# Patient Record
Sex: Male | Born: 1970
Health system: Southern US, Community
[De-identification: ages and names within clinical notes are randomized; demographics above are authoritative.]

## PROBLEM LIST (undated history)

## (undated) DIAGNOSIS — K219 Gastro-esophageal reflux disease without esophagitis: Secondary | ICD-10-CM

## (undated) DIAGNOSIS — E119 Type 2 diabetes mellitus without complications: Secondary | ICD-10-CM

## (undated) DIAGNOSIS — I10 Essential (primary) hypertension: Secondary | ICD-10-CM

## (undated) HISTORY — PX: EYE SURGERY: SHX253

## (undated) HISTORY — DX: Type 2 diabetes mellitus without complications: E11.9

## (undated) HISTORY — DX: Essential (primary) hypertension: I10

## (undated) HISTORY — DX: Gastro-esophageal reflux disease without esophagitis: K21.9

---

## 2002-06-02 ENCOUNTER — Emergency Department (HOSPITAL_COMMUNITY): Admission: EM | Admit: 2002-06-02 | Discharge: 2002-06-02 | Payer: Self-pay

## 2003-05-27 ENCOUNTER — Encounter: Admission: RE | Admit: 2003-05-27 | Discharge: 2003-08-25 | Payer: Self-pay | Admitting: Family Medicine

## 2008-03-12 ENCOUNTER — Emergency Department (HOSPITAL_COMMUNITY): Admission: EM | Admit: 2008-03-12 | Discharge: 2008-03-12 | Payer: Self-pay | Admitting: Emergency Medicine

## 2008-03-30 ENCOUNTER — Emergency Department (HOSPITAL_COMMUNITY): Admission: EM | Admit: 2008-03-30 | Discharge: 2008-03-30 | Payer: Self-pay | Admitting: Family Medicine

## 2008-05-05 ENCOUNTER — Emergency Department (HOSPITAL_COMMUNITY): Admission: EM | Admit: 2008-05-05 | Discharge: 2008-05-05 | Payer: Self-pay | Admitting: Family Medicine

## 2008-07-15 ENCOUNTER — Emergency Department (HOSPITAL_COMMUNITY): Admission: EM | Admit: 2008-07-15 | Discharge: 2008-07-15 | Payer: Self-pay | Admitting: Family Medicine

## 2008-09-22 ENCOUNTER — Ambulatory Visit: Payer: Self-pay | Admitting: Internal Medicine

## 2008-09-28 ENCOUNTER — Ambulatory Visit: Payer: Self-pay | Admitting: *Deleted

## 2008-10-07 ENCOUNTER — Ambulatory Visit: Payer: Self-pay | Admitting: Internal Medicine

## 2011-01-04 ENCOUNTER — Inpatient Hospital Stay (INDEPENDENT_AMBULATORY_CARE_PROVIDER_SITE_OTHER)
Admission: RE | Admit: 2011-01-04 | Discharge: 2011-01-04 | Disposition: A | Discharge status: Home / Self Care | Payer: Self-pay | Source: Ambulatory Visit | Attending: Family Medicine | Admitting: Family Medicine

## 2011-01-04 DIAGNOSIS — E119 Type 2 diabetes mellitus without complications: Secondary | ICD-10-CM

## 2011-01-04 LAB — POCT URINALYSIS DIP (DEVICE)
Bilirubin Urine: NEGATIVE
Glucose, UA: 500 mg/dL — AB
Hgb urine dipstick: NEGATIVE
Ketones, ur: NEGATIVE mg/dL
Specific Gravity, Urine: <=1.005 (ref 1.005–1.030)

## 2011-02-13 ENCOUNTER — Emergency Department (HOSPITAL_COMMUNITY)
Admission: EM | Admit: 2011-02-13 | Discharge: 2011-02-13 | Disposition: A | Discharge status: Home / Self Care | Payer: Self-pay | Source: Home / Self Care | Attending: Family Medicine | Admitting: Family Medicine

## 2011-02-13 ENCOUNTER — Encounter: Payer: Self-pay | Admitting: Emergency Medicine

## 2011-02-13 DIAGNOSIS — H0019 Chalazion unspecified eye, unspecified eyelid: Secondary | ICD-10-CM

## 2011-02-13 DIAGNOSIS — H109 Unspecified conjunctivitis: Secondary | ICD-10-CM

## 2011-02-13 DIAGNOSIS — H0011 Chalazion right upper eyelid: Secondary | ICD-10-CM

## 2011-02-13 MED ORDER — POLYMYXIN B-TRIMETHOPRIM 10000-0.1 UNIT/ML-% OP SOLN: 1.0000 [drp] | OPHTHALMIC | Status: AC

## 2011-02-13 MED ORDER — IBUPROFEN 800 MG PO TABS: 800.0000 mg | ORAL_TABLET | Freq: Three times a day (TID) | ORAL | Status: AC | PRN

## 2011-02-13 NOTE — ED Notes (Signed)
Pt here with sore throat and tenderness to bilat neck with swallowing and right eye drainage and crusty upon waking up that started x 1wk ago.denies dizziness but has some blurry vision.pt used tea bag and states it was effective.

## 2011-02-13 NOTE — ED Provider Notes (Signed)
History     CSN: [id number] Arrival date & time: 02/13/2011 10:06 AM   First MD Initiated Contact with Patient 02/13/11 1045      Chief Complaint  Patient presents with  . Eye Drainage  . Sore Throat    (Consider location/radiation/quality/duration/timing/severity/associated sxs/prior treatment) HPI Comments: Ediel presents for evaluation of a swelling on his RIGHT upper eyelid over the last week. He tried using a teabag with some relief. He denies any pain or visual symptoms. He also reports bilateral eye redness with itching and tearing. He denies any visual disturbance. He also reports sore throat without cough, no fever.   Patient is a 40 y.o. male presenting with pharyngitis and eye problem. The history is provided by the patient.  Sore Throat This is a new problem. The current episode started more than 2 days ago. The problem occurs constantly. The problem has not changed since onset.Pertinent negatives include no shortness of breath. The symptoms are aggravated by nothing. The symptoms are relieved by nothing.  Eye Problem  This is a new problem. The current episode started more than 2 days ago. The problem occurs constantly. The problem has been gradually improving. There is pain in the right eye. There was no injury mechanism. The patient is experiencing no pain. There is no history of trauma to the eye. There is no known exposure to pink eye. He does not wear contacts. Associated symptoms include eye redness and itching. Pertinent negatives include no blurred vision, no decreased vision, no discharge and no double vision.    Past Medical History  Diagnosis Date  . Diabetes mellitus     History reviewed. No pertinent past surgical history.  History reviewed. No pertinent family history.  History  Substance Use Topics  . Smoking status: Never Smoker   . Smokeless tobacco: Not on file  . Alcohol Use: No      Review of Systems  Constitutional: Negative.   HENT:  Positive for sore throat. Negative for congestion, rhinorrhea and trouble swallowing.   Eyes: Positive for redness and itching. Negative for blurred vision, double vision and discharge.  Respiratory: Negative for cough, shortness of breath and wheezing.   Cardiovascular: Negative.   Gastrointestinal: Negative.   Genitourinary: Negative.   Musculoskeletal: Negative.   Skin: Positive for itching.  Neurological: Negative.     Allergies  Review of patient's allergies indicates no known allergies.  Home Medications   Current Outpatient Rx  Name Route Sig Dispense Refill  . INSULIN GLARGINE 100 UNIT/ML [place] SOLN Subcutaneous Inject 50 Units into the skin at bedtime.      Marland Kitchen POLYMYXIN B-TRIMETHOPRIM 10000-0.1 UNIT/ML-% OP SOLN Both Eyes Place 1 drop into both eyes every 4 (four) hours. 10 mL 0    BP 126/86  Pulse 71  Temp(Src) 98.5 F (36.9 C) (Oral)  Resp 16  SpO2 100%  Physical Exam  Nursing note and vitals reviewed. Constitutional: He is oriented to person, place, and time. He appears well-developed and well-nourished.  HENT:  Head: Normocephalic and atraumatic.  Right Ear: Tympanic membrane normal.  Left Ear: Tympanic membrane normal.  Mouth/Throat: Uvula is midline, oropharynx is clear and moist and mucous membranes are normal.  Eyes: EOM are normal. Pupils are equal, round, and reactive to light. Right eye exhibits hordeolum. Right eye exhibits no discharge and no exudate. Left eye exhibits no discharge, no exudate and no hordeolum. Right conjunctiva is injected. Right conjunctiva has no hemorrhage. Left conjunctiva is injected. Left conjunctiva has no hemorrhage.  Neck: Normal range of motion.  Pulmonary/Chest: Effort normal and breath sounds normal.  Musculoskeletal: Normal range of motion.  Neurological: He is alert and oriented to person, place, and time.  Skin: Skin is warm and dry.  Psychiatric: His behavior is normal.    ED Course  Procedures (including critical care  time)  Labs Reviewed - No data to display No results found.   1. Chalazion of right upper eyelid   2. Conjunctivitis       MDM          Richardo Priest, MD 02/13/11 1159

## 2011-07-21 ENCOUNTER — Emergency Department (INDEPENDENT_AMBULATORY_CARE_PROVIDER_SITE_OTHER)
Admission: EM | Admit: 2011-07-21 | Discharge: 2011-07-21 | Disposition: A | Discharge status: Home / Self Care | Payer: Self-pay | Source: Home / Self Care | Attending: Emergency Medicine | Admitting: Emergency Medicine

## 2011-07-21 ENCOUNTER — Encounter (HOSPITAL_COMMUNITY): Payer: Self-pay | Admitting: *Deleted

## 2011-07-21 DIAGNOSIS — N342 Other urethritis: Secondary | ICD-10-CM

## 2011-07-21 LAB — POCT URINALYSIS DIP (DEVICE)
Hgb urine dipstick: NEGATIVE
Nitrite: NEGATIVE
Specific Gravity, Urine: 1.01 (ref 1.005–1.030)
Urobilinogen, UA: 0.2 mg/dL (ref 0.0–1.0)
pH: 5 (ref 5.0–8.0)

## 2011-07-21 MED ORDER — LIDOCAINE HCL (PF) 1 % IJ SOLN
INTRAMUSCULAR | Status: AC
  Filled 2011-07-21: qty 5

## 2011-07-21 MED ORDER — CEFTRIAXONE SODIUM 250 MG IJ SOLR
INTRAMUSCULAR | Status: AC
  Filled 2011-07-21: qty 250

## 2011-07-21 MED ORDER — AZITHROMYCIN 250 MG PO TABS
ORAL_TABLET | ORAL | Status: AC
  Filled 2011-07-21: qty 4

## 2011-07-21 MED ORDER — CEFTRIAXONE SODIUM 250 MG IJ SOLR
250.0000 mg | Freq: Once | INTRAMUSCULAR | Status: AC
  Administered 2011-07-21: 250 mg via INTRAMUSCULAR

## 2011-07-21 MED ORDER — AZITHROMYCIN 250 MG PO TABS
1000.0000 mg | ORAL_TABLET | Freq: Once | ORAL | Status: AC
  Administered 2011-07-21: 1000 mg via ORAL

## 2011-07-21 NOTE — ED Notes (Signed)
PT  REPORTS  NO  PAIN  EXCEPT  WHEN  HE  URINATES  AND  THEN  IT IS  ABOUT    4    WHICH  HE  DESCIBES  AS  A  BURNING  SENSATION

## 2011-07-21 NOTE — ED Notes (Signed)
pT  REPORTS   HE  WAS  STRUCK  IN  GENITAL  AREA   ABOUT      6  DAYS      AGO         HE    STATES HE  HAS  PAINFULL URINATION AS  WELL    AS     FLUID  COMING FROM [facility]   X  3  DAYS  -  HE  REPORTS  SHE   HAD  SOME  TESTICULER  SWELLING  EARLIER BUT THAT  IS  BETTER  AT THIS  TIME

## 2011-07-21 NOTE — Discharge Instructions (Signed)
Urethritis, Adult  Urethritis is an inflammation (soreness) of the urethra (the tube exiting from the bladder). It is often caused by germs that may be spread through sexual contact.  TREATMENT   Urethritis will usually respond to antibiotics. These are medications that kill germs. Take all the medicine given to you. You may feel better in a couple days, but TAKE ALL MEDICINE or the infection may not be completely cured and may become more difficult to treat. Response can generally be expected in 7 to 10 days. You may require additional treatment after more testing.  HOME CARE INSTRUCTIONS   Not have sex until the test results are known and treatment is completed.   Know that you may be asked to notify your sex partner when your final test results are back.   Finish all medications as prescribed.   Prevent sexually transmitted infections including AIDS. Practice safe sex. Use condoms.  SEEK MEDICAL CARE IF:    Your symptoms are not improved in 2 to 3 days.   Your symptoms are getting worse.   Your develop abdominal pain.   You develop joint pain.  SEEK IMMEDIATE MEDICAL CARE IF:    You have a fever.   You develop severe pain in the belly, back or side.   You develop repeated vomiting.  TEST RESULTS  Not all test results are available during your visit. If your test results are not back during the visit, make an appointment with your caregiver to find out the results. Do not assume everything is normal if you have not heard from your caregiver or the medical facility. It is important for you to follow-up on all of your test results.  Document Released: 08/16/2000 Document Revised: 02/09/2011 Document Reviewed: 03/08/2009  [employer] Patient Information 2012 [employer].

## 2011-07-21 NOTE — ED Provider Notes (Signed)
Chief Complaint  Patient presents with  . Dysuria    History of Present Illness:   The patient is a 41 year old male who was struck in the testicles a week ago with a soccer ball. This particular structure right testicle he had pain in her about a day but then went way after that. He was well up until 2 days ago when he developed slight dysuria, decrease in his stream, and a clear urethral discharge. He denies any swelling of the testicles or pain the testicles or lumps. He denies any lesions of the penis, fever, chills, adenopathy, skin rash, or joint pain.  Review of Systems:  Other than noted above, the patient denies any of the following symptoms: General:  No fevers, chills, sweats, aches, or fatigue. GI:  No abdominal pain, back pain, nausea, vomiting, diarrhea, or constipation. GU:  No dysuria, frequency, urgency, hematuria, urethral discharge, penile lesions, penile pain, testicular pain, swelling, or mass, inguinal lymphadenopathy or incontinence.  PMFSH:  Past medical history, family history, social history, meds, and allergies were reviewed.  Physical Exam:   Vital signs:  BP 114/65  Pulse 74  Temp(Src) 98.2 F (36.8 C) (Oral)  Resp 16  SpO2 98% Gen:  Alert, oriented, in no distress. Lungs:  Clear to auscultation, no wheezes, rales or rhonchi. Heart:  Regular rhythm, no gallop or murmer. Abdomen:  Flat and soft.  No tenderness to palpation, guarding, or rebound.  No hepato-splenomegaly or mass.  Bowel sounds were normally active.  No hernia. Genital exam:  Normal external genitalia. No lesions on the penis. No urethral discharge. The urethra appears normal. No testicular tenderness, swelling, or pain to palpation. No masses. Rectal exam:  Digital rectal exam reveals no masses, normal prostate, no tenderness, no nodules, and heme-negative stool. Back:  No CVA tenderness.  Skin:  Clear, warm and dry.  Labs:   Results for orders placed during the hospital encounter of 07/21/11    POCT URINALYSIS DIP (DEVICE)      Component Value Range   Glucose, UA 500 (*) NEGATIVE (mg/dL)   Bilirubin Urine NEGATIVE  NEGATIVE    Ketones, ur NEGATIVE  NEGATIVE (mg/dL)   Specific Gravity, Urine 1.010  1.005 - 1.030    Hgb urine dipstick NEGATIVE  NEGATIVE    pH 5.0  5.0 - 8.0    Protein, ur NEGATIVE  NEGATIVE (mg/dL)   Urobilinogen, UA 0.2  0.0 - 1.0 (mg/dL)   Nitrite NEGATIVE  NEGATIVE    Leukocytes, UA NEGATIVE  NEGATIVE   OCCULT BLOOD, POC DEVICE      Component Value Range   Fecal Occult Bld NEGATIVE      Course in Urgent Care Center:   He was given Rocephin 250 mg IM and azithromycin 1000 mg by mouth. GC and Chlamydia DNA probes were obtained.  Assessment: The encounter diagnosis was Urethritis.   Plan:   1.  The following meds were prescribed:   New Prescriptions   No medications on file   2.  The patient was instructed in symptomatic care and handouts were given. 3.  The patient was told to return if becoming worse in any way, if no better in 3 or 4 days, and given some red flag symptoms that would indicate earlier return.     Reuben Likes, MD 07/21/11 [phone]

## 2011-07-22 LAB — URINE CULTURE
Colony Count: NO GROWTH
Culture  Setup Time: 201305171642
Special Requests: NORMAL

## 2011-07-22 LAB — GC/CHLAMYDIA PROBE AMP, GENITAL: Chlamydia, DNA Probe: NEGATIVE

## 2011-11-24 ENCOUNTER — Encounter (HOSPITAL_COMMUNITY): Payer: Self-pay

## 2011-11-24 ENCOUNTER — Emergency Department (INDEPENDENT_AMBULATORY_CARE_PROVIDER_SITE_OTHER)
Admission: EM | Admit: 2011-11-24 | Discharge: 2011-11-24 | Disposition: A | Discharge status: Home / Self Care | Payer: Self-pay | Source: Home / Self Care

## 2011-11-24 DIAGNOSIS — E119 Type 2 diabetes mellitus without complications: Secondary | ICD-10-CM

## 2011-11-24 MED ORDER — GLUCOSE BLOOD VI STRP: ORAL_STRIP | Status: DC

## 2011-11-24 MED ORDER — INSULIN GLARGINE 100 UNIT/ML ~~LOC~~ SOLN: 50.0000 [IU] | Freq: Every day | SUBCUTANEOUS | Status: DC

## 2011-11-24 NOTE — ED Provider Notes (Signed)
History     CSN: [id number]  Arrival date & time 11/24/11  1453   None     Chief Complaint  Patient presents with  . Medication Refill    (Consider location/radiation/quality/duration/timing/severity/associated sxs/prior treatment) HPI Comments: This is a former health service patient and is a type II diabetic. He is here to request refills for his insulin solostar pen and testing strips. He denies any symptoms or problems and is currently looking for another physician to see him for his chronic illnesses.   Past Medical History  Diagnosis Date  . Diabetes mellitus     History reviewed. No pertinent past surgical history.  No family history on file.  History  Substance Use Topics  . Smoking status: Never Smoker   . Smokeless tobacco: Not on file  . Alcohol Use: No      Review of Systems  Constitutional: Negative.   Respiratory: Negative.   Cardiovascular: Negative.   Genitourinary: Negative.     Allergies  Review of patient's allergies indicates no known allergies.  Home Medications   Current Outpatient Rx  Name Route Sig Dispense Refill  . GLUCOSE BLOOD VI STRP Other 1 each by Other route as needed. Use as instructed    . INSULIN GLARGINE 100 UNIT/ML [place] SOLN Subcutaneous Inject 50 Units into the skin at bedtime.      Marland Kitchen GLUCOSE BLOOD VI STRP  Check your sugar in the morning before you eat breakfast, and one hour after a meal. 100 each 12  . INSULIN GLARGINE 100 UNIT/ML [place] SOLN Subcutaneous Inject 50 Units into the skin at bedtime. 10 mL 0    BP 138/93  Pulse 72  Temp 98.5 F (36.9 C) (Oral)  Resp 18  SpO2 98%  Physical Exam  Constitutional: He is oriented to person, place, and time. He appears well-developed and well-nourished. No distress.  Neck: Normal range of motion. Neck supple.  Pulmonary/Chest: Effort normal and breath sounds normal. No respiratory distress.  Abdominal: Soft. There is no tenderness.  Musculoskeletal: Normal range of motion.  He exhibits no edema and no tenderness.  Lymphadenopathy:    He has no cervical adenopathy.  Neurological: He is alert and oriented to person, place, and time. No cranial nerve deficit.  Skin: Skin is warm and dry.    ED Course  Procedures (including critical care time)  Labs Reviewed - No data to display No results found.   1. T2DM (type 2 diabetes mellitus)       MDM  Refill Lantus Solostar 50 units q hs Ultra ONe Strips for testing.        Hayden Rasmussen, NP 11/24/11 1644

## 2011-11-24 NOTE — ED Provider Notes (Signed)
Medical screening examination/treatment/procedure(s) were performed by non-physician practitioner and as supervising physician I was immediately available for consultation/collaboration.  Raynald Blend, MD 11/24/11 [phone]

## 2011-11-24 NOTE — ED Notes (Signed)
Patient needs to have Solostar pens refilled and test strips, he test 3x daily

## 2012-05-09 ENCOUNTER — Encounter (HOSPITAL_COMMUNITY): Payer: Self-pay | Admitting: Emergency Medicine

## 2012-05-09 ENCOUNTER — Emergency Department (HOSPITAL_COMMUNITY)
Admission: EM | Admit: 2012-05-09 | Discharge: 2012-05-09 | Disposition: A | Discharge status: Home / Self Care | Payer: Self-pay | Source: Home / Self Care

## 2012-05-09 MED ORDER — INSULIN GLARGINE 100 UNIT/ML ~~LOC~~ SOLN: 50.0000 [IU] | Freq: Every day | SUBCUTANEOUS | Status: DC

## 2012-05-09 MED ORDER — GLUCOSE BLOOD VI STRP: ORAL_STRIP | Status: DC

## 2012-05-09 NOTE — ED Provider Notes (Signed)
Patient Demographics  Jake Miller, is a 42 y.o. male  ZOX:[id number]  WJX:[id number]  DOB - 12-24-1970  Chief Complaint  Patient presents with  . Follow-up  . Medication Refill        Subjective:   [place]ane [place] today has, No headache, No chest pain, No abdominal pain - No Nausea, No new weakness tingling or numbness, No Cough - SOB.    Objective:    Filed Vitals:   05/09/12 1717  BP: 104/74  Pulse: 81  Temp: 97.9 F (36.6 C)  TempSrc: Oral  Resp: 18  SpO2: 100%     Exam  Awake Alert, Oriented X 3, No new F.N deficits, Normal affect [place].AT,PERRAL Supple Neck,No JVD, No cervical lymphadenopathy appriciated.  Symmetrical Chest wall movement, Good air movement bilaterally, CTAB RRR,No Gallops,Rubs or new Murmurs, No Parasternal Heave +ve B.Sounds, Abd Soft, Non tender, No organomegaly appriciated, No rebound - guarding or rigidity. No Cyanosis, Clubbing or edema, No new Rash or bruise       Data Review   CBC No results found for this basename: WBC, HGB, HCT, PLT, MCV, MCH, MCHC, RDW, NEUTRABS, LYMPHSABS, MONOABS, EOSABS, BASOSABS, BANDABS, BANDSABD,  in the last 168 hours  Chemistries   No results found for this basename: NA, K, CL, CO2, GLUCOSE, BUN, CREATININE, GFRCGP, CALCIUM, MG, AST, ALT, ALKPHOS, BILITOT,  in the last 168 hours ------------------------------------------------------------------------------------------------------------------ No results found for this basename: HGBA1C,  in the last 72 hours ------------------------------------------------------------------------------------------------------------------ No results found for this basename: CHOL, HDL, LDLCALC, TRIG, CHOLHDL, LDLDIRECT,  in the last 72 hours ------------------------------------------------------------------------------------------------------------------ No results found for this basename: TSH, T4TOTAL, FREET3, T3FREE, THYROIDAB,  in the last 72  hours ------------------------------------------------------------------------------------------------------------------ No results found for this basename: VITAMINB12, FOLATE, FERRITIN, TIBC, IRON, RETICCTPCT,  in the last 72 hours  Coagulation profile  No results found for this basename: INR, PROTIME,  in the last 168 hours     Prior to Admission medications   Medication Sig Start Date End Date Taking? Authorizing Provider  glucose blood test strip Check your sugar in the morning before you eat breakfast, and one hour after a meal. 05/09/12   Leroy Sea, MD  insulin glargine (LANTUS) 100 UNIT/ML injection Inject 50 Units into the skin at bedtime. 05/09/12   Leroy Sea, MD     Assessment & Plan   DM-2 - on lantus only for yrs, checks CBG once only in am, counseled on QAC-HS accuchecks, and to come back in a week with log book, will check A1c today. Lantus and testing supplies given.    Follow-up Information   Follow up with primary care provider. Schedule an appointment as soon as possible for a visit in 1 week.       Leroy Sea M.D on 05/09/2012 at 5:23 PM  Leroy Sea, MD 05/09/12 [phone]

## 2012-05-09 NOTE — ED Notes (Signed)
Pt is here for a f/u and needing refill on his meds Denies any medical problems today  He is alert and oriented w/no signs of acute distress.

## 2012-05-20 ENCOUNTER — Encounter (HOSPITAL_COMMUNITY): Payer: Self-pay | Admitting: *Deleted

## 2012-05-20 ENCOUNTER — Emergency Department (HOSPITAL_COMMUNITY)
Admission: EM | Admit: 2012-05-20 | Discharge: 2012-05-20 | Disposition: A | Discharge status: Home / Self Care | Payer: No Typology Code available for payment source | Source: Home / Self Care

## 2012-05-20 NOTE — ED Provider Notes (Signed)
History     CSN: [id number]  Arrival date & time 05/20/12  1303   None     Chief Complaint  Patient presents with  . Follow-up    (Consider location/radiation/quality/duration/timing/severity/associated sxs/prior treatment) HPI  Pt is 42 yo male who presents for follow up on diabetes and would like to have prescription for insulin pen. Patient reports being regular state of health, no chest pain or shortness of breath, no specific abdominal or urinary concerns, no recent sicknesses or hospitalizations. Patient reports checking sugar levels regularly and the numbers usually stay between 100  -  150.   Past Medical History  Diagnosis Date  . Diabetes mellitus     History reviewed. No pertinent past surgical history.  No family medical history per pt  History  Substance Use Topics  . Smoking status: Never Smoker   . Smokeless tobacco: Not on file  . Alcohol Use: No    Review of Systems  Constitutional: Negative for fever, chills, diaphoresis, activity change, appetite change and fatigue.  HENT: Negative for ear pain, nosebleeds, congestion, facial swelling, rhinorrhea, neck pain, neck stiffness and ear discharge.   Eyes: Negative for pain, discharge, redness, itching and visual disturbance.  Respiratory: Negative for cough, choking, chest tightness, shortness of breath, wheezing and stridor.   Cardiovascular: Negative for chest pain, palpitations and leg swelling.  Gastrointestinal: Negative for abdominal distention.  Genitourinary: Negative for dysuria, urgency, frequency, hematuria, flank pain, decreased urine volume, difficulty urinating and dyspareunia.  Musculoskeletal: Negative for back pain, joint swelling, arthralgias and gait problem.  Neurological: Negative for dizziness, tremors, seizures, syncope, facial asymmetry, speech difficulty, weakness, light-headedness, numbness and headaches.  Hematological: Negative for adenopathy. Does not bruise/bleed easily.   Psychiatric/Behavioral: Negative for hallucinations, behavioral problems, confusion, dysphoric mood, decreased concentration and agitation.    Allergies  Review of patient's allergies indicates no known allergies.  Home Medications   Current Outpatient Rx  Name  Route  Sig  Dispense  Refill  . glucose blood test strip      Check your sugar in the morning before you eat breakfast, and one hour after a meal.   100 each   12   . insulin glargine (LANTUS) 100 UNIT/ML injection   Subcutaneous   Inject 50 Units into the skin at bedtime.   10 mL   2     BP 130/88  Pulse 80  Temp(Src) 98.4 F (36.9 C) (Oral)  Resp 16  SpO2 98%  Physical Exam  Constitutional: Appears well-developed and well-nourished. No distress.  HENT: Normocephalic. External right and left ear normal. Oropharynx is clear and moist.  Eyes: Conjunctivae and EOM are normal. PERRLA, no scleral icterus.  Neck: Normal ROM. Neck supple. No JVD. No tracheal deviation. No thyromegaly.  CVS: RRR, S1/S2 +, no murmurs, no gallops, no carotid bruit.  Pulmonary: Effort and breath sounds normal, no stridor, rhonchi, wheezes, rales.  Abdominal: Soft. BS +,  no distension, tenderness, rebound or guarding.  Musculoskeletal: Normal range of motion. No edema and no tenderness.  Lymphadenopathy: No lymphadenopathy noted, cervical, inguinal. Neuro: Alert. Normal reflexes, muscle tone coordination. No cranial nerve deficit. Skin: Skin is warm and dry. No rash noted. Not diaphoretic. No erythema. No pallor.  Psychiatric: Normal mood and affect. Behavior, judgment, thought content normal.    ED Course  Procedures (including critical care time)  Labs Reviewed - No data to display No results found.   1. Diabetes   - patient has brought logbook as recommended by  physician, numbers range from 100-169 - I will provide prescription for insulin Lantus pen, called he'll department pharmacy at [phone] - Patient advised to come  back in 3 months to have A1c checked - We have discussed continuing to monitor sugar levels as patient is already doing, he was complemented on his hard work    MDM  Diabetes         Dorothea Ogle, MD 05/20/12 1346

## 2012-05-20 NOTE — ED Notes (Signed)
Patient states he is here for follow up for labs drawn last week. States Rx for insulin was written wrong; pharmacy told him that they could not get in touch with the doctor to correct problem.

## 2012-11-18 ENCOUNTER — Encounter (HOSPITAL_COMMUNITY): Payer: Self-pay | Admitting: Emergency Medicine

## 2012-11-18 ENCOUNTER — Emergency Department (HOSPITAL_COMMUNITY)
Admission: EM | Admit: 2012-11-18 | Discharge: 2012-11-18 | Disposition: A | Discharge status: Home / Self Care | Payer: No Typology Code available for payment source | Source: Home / Self Care | Attending: Family Medicine | Admitting: Family Medicine

## 2012-11-18 DIAGNOSIS — E119 Type 2 diabetes mellitus without complications: Secondary | ICD-10-CM

## 2012-11-18 MED ORDER — OMEPRAZOLE 20 MG PO CPDR: 20.0000 mg | DELAYED_RELEASE_CAPSULE | Freq: Every day | ORAL | Status: DC

## 2012-11-18 MED ORDER — INSULIN GLARGINE 100 UNIT/ML ~~LOC~~ SOLN: 50.0000 [IU] | Freq: Every day | SUBCUTANEOUS | Status: DC

## 2012-11-18 NOTE — ED Provider Notes (Signed)
Jake Miller is a 42 y.o. male who presents to Urgent Care today for Lantus refill. Patient is doing well with no symptoms and diabetes. He ran out yesterday evening. [facility] He denies any polyuria or hypoglycemic symptoms.   He also notes of the past month or so intermittent abdominal pain. This does to occur after eating. It feels like previous episodes of heartburn. He's taking some over-the-counter acid blocking medications which have helped. He would like a prescription for this or possible. He denies any nausea vomiting or diarrhea. He denies any blood in the stool. He feels well otherwise   Past Medical History  Diagnosis Date  . Diabetes mellitus    History  Substance Use Topics  . Smoking status: Never Smoker   . Smokeless tobacco: Not on file  . Alcohol Use: No   ROS as above: No fevers or chills or significant weight loss or weight Medications reviewed. No current facility-administered medications for this encounter.   Current Outpatient Prescriptions  Medication Sig Dispense Refill  . glucose blood test strip Check your sugar in the morning before you eat breakfast, and one hour after a meal.  100 each  12  . insulin glargine (LANTUS) 100 UNIT/ML injection Inject 0.5 mLs (50 Units total) into the skin at bedtime.  10 mL  2  . omeprazole (PRILOSEC) 20 MG capsule Take 1 capsule (20 mg total) by mouth daily.  30 capsule  2    Exam:  BP 115/82  Pulse 74  Temp(Src) 97.8 F (36.6 C) (Oral)  Resp 16  SpO2 98% Gen: Well NAD HEENT: EOMI,  MMM Lungs: CTABL Nl WOB Heart: RRR no MRG Abd: NABS, NT, ND Exts: Non edematous BL  LE, warm and well perfused.   No results found for this or any previous visit (from the past 24 hour(s)). No results found.  Assessment and Plan: 42 y.o. male with  1) diabetes: Refill Lantus. Followup with primary care provider for diabetes.  2) abdominal pain: Likely reflux related.  We'll use omeprazole and follow up with primary care provider. Discussed warning signs or symptoms. Please see discharge instructions. Patient expresses understanding.      Rodolph Bong, MD 11/18/12 [phone]

## 2012-11-18 NOTE — ED Notes (Signed)
Pt here for medication refill. [place] 50 units. States "ran out last night" Voices no concerns at this time.

## 2013-01-06 ENCOUNTER — Encounter: Payer: Self-pay | Admitting: Internal Medicine

## 2013-01-06 ENCOUNTER — Telehealth: Payer: Self-pay

## 2013-01-06 ENCOUNTER — Ambulatory Visit: Payer: No Typology Code available for payment source | Attending: Internal Medicine | Admitting: Internal Medicine

## 2013-01-06 VITALS — BP 130/90 | HR 83 | Temp 98.9°F | Resp 16 | Ht 68.0 in | Wt 187.0 lb

## 2013-01-06 DIAGNOSIS — Z794 Long term (current) use of insulin: Secondary | ICD-10-CM

## 2013-01-06 DIAGNOSIS — E119 Type 2 diabetes mellitus without complications: Secondary | ICD-10-CM | POA: Insufficient documentation

## 2013-01-06 DIAGNOSIS — K219 Gastro-esophageal reflux disease without esophagitis: Secondary | ICD-10-CM | POA: Insufficient documentation

## 2013-01-06 LAB — CBC WITH DIFFERENTIAL/PLATELET
Basophils Relative: 0 % (ref 0–1)
Eosinophils Absolute: 0.2 10*3/uL (ref 0.0–0.7)
HCT: 44.8 % (ref 39.0–52.0)
Hemoglobin: 16 g/dL (ref 13.0–17.0)
Lymphs Abs: 2.4 10*3/uL (ref 0.7–4.0)
MCH: 29.3 pg (ref 26.0–34.0)
MCHC: 35.7 g/dL (ref 30.0–36.0)
Monocytes Absolute: 0.8 10*3/uL (ref 0.1–1.0)
Monocytes Relative: 9 % (ref 3–12)
Neutro Abs: 5.6 10*3/uL (ref 1.7–7.7)
RBC: 5.46 MIL/uL (ref 4.22–5.81)

## 2013-01-06 LAB — CMP AND LIVER
Albumin: 4.4 g/dL (ref 3.5–5.2)
Alkaline Phosphatase: 86 U/L (ref 39–117)
BUN: 18 mg/dL (ref 6–23)
Bilirubin, Direct: 0.1 mg/dL (ref 0.0–0.3)
CO2: 28 mEq/L (ref 19–32)
Glucose, Bld: 332 mg/dL — ABNORMAL HIGH (ref 70–99)
Indirect Bilirubin: 0.6 mg/dL (ref 0.0–0.9)
Potassium: 4.6 mEq/L (ref 3.5–5.3)
Total Protein: 7.2 g/dL (ref 6.0–8.3)

## 2013-01-06 LAB — LIPID PANEL
Cholesterol: 188 mg/dL (ref 0–200)
Total CHOL/HDL Ratio: 5.2 Ratio
VLDL: 42 mg/dL — ABNORMAL HIGH (ref 0–40)

## 2013-01-06 LAB — HEMOGLOBIN A1C
Hgb A1c MFr Bld: 10.7 % — ABNORMAL HIGH (ref <5.7)
Mean Plasma Glucose: 260 mg/dL — ABNORMAL HIGH (ref <117)

## 2013-01-06 MED ORDER — OMEPRAZOLE 20 MG PO CPDR: 20.0000 mg | DELAYED_RELEASE_CAPSULE | Freq: Every day | ORAL | Status: DC

## 2013-01-06 MED ORDER — INSULIN GLARGINE 100 UNIT/ML ~~LOC~~ SOLN: 50.0000 [IU] | Freq: Every day | SUBCUTANEOUS | Status: DC

## 2013-01-06 NOTE — Progress Notes (Signed)
Patient ID: Jake Miller, male   DOB: [birth year], 42 y.o.   MRN: [id number] Patient Demographics  Jake Miller, is a 42 y.o. male  BJY:[id number]  YQM:[id number]  DOB - [birth year]  CC:  Chief Complaint  Patient presents with  . Establish Care       HPI: Jake Miller is a 42 y.o. male here today to establish medical care. Patient is known to have diabetes for the past several years on insulin Lantus. Patient claims the blood sugar has been within control, home blood glucose range between 85 in the morning and 180 during the day before dinner. Patient has no complaint related to diabetes complications. He claims to be doing well, exercise regularly, does not drink alcohol, he does not smoke cigarette. However recently patient has distance some epigastric pain especially when hungry, pain is relieved with eating, as well as Prilosec. No change in bowel habit, no vomiting. Patient has his eyes checked about 2 years ago by ophthalmologist, normal Patient has No headache, No chest pain, - No Nausea, No new weakness tingling or numbness, No Cough - SOB.  No Known Allergies Past Medical History  Diagnosis Date  . Diabetes mellitus    Current Outpatient Prescriptions on File Prior to Visit  Medication Sig Dispense Refill  . glucose blood test strip Check your sugar in the morning before you eat breakfast, and one hour after a meal.  100 each  12   No current facility-administered medications on file prior to visit.   History reviewed. No pertinent family history. History   Social History  . Marital Status: Married    Spouse Name: N/A    Number of Children: N/A  . Years of Education: N/A   Occupational History  . Not on file.   Social History Main Topics  . Smoking status: Never Smoker   . Smokeless tobacco: Not on file  . Alcohol Use: No  . Drug Use: No  . Sexual Activity: Yes   Other Topics Concern  . Not on file   Social History Narrative  . No narrative on file     Review of Systems: Constitutional: Negative for fever, chills, diaphoresis, activity change, appetite change and fatigue. HENT: Negative for ear pain, nosebleeds, congestion, facial swelling, rhinorrhea, neck pain, neck stiffness and ear discharge.  Eyes: Negative for pain, discharge, redness, itching and visual disturbance. Respiratory: Negative for cough, choking, chest tightness, shortness of breath, wheezing and stridor.  Cardiovascular: Negative for chest pain, palpitations and leg swelling. Gastrointestinal: Negative for abdominal distention. Genitourinary: Negative for dysuria, urgency, frequency, hematuria, flank pain, decreased urine volume, difficulty urinating and dyspareunia.  Musculoskeletal: Negative for back pain, joint swelling, arthralgia and gait problem. Neurological: Negative for dizziness, tremors, seizures, syncope, facial asymmetry, speech difficulty, weakness, light-headedness, numbness and headaches.  Hematological: Negative for adenopathy. Does not bruise/bleed easily. Psychiatric/Behavioral: Negative for hallucinations, behavioral problems, confusion, dysphoric mood, decreased concentration and agitation.    Objective:   Filed Vitals:   01/06/13 0922  BP: 130/90  Pulse: 83  Temp: 98.9 F (37.2 C)  Resp: 16    Physical Exam: Constitutional: Patient appears well-developed and well-nourished. No distress. HENT: Normocephalic, atraumatic, External right and left ear normal. Oropharynx is clear and moist.  Eyes: Conjunctivae and EOM are normal. PERRLA, no scleral icterus. Neck: Normal ROM. Neck supple. No JVD. No tracheal deviation. No thyromegaly. CVS: RRR, S1/S2 +, no murmurs, no gallops, no carotid bruit.  Pulmonary: Effort and breath sounds normal, no stridor, rhonchi, wheezes, rales.  Abdominal: Soft. BS +, no distension, tenderness, rebound or guarding.  Musculoskeletal: Normal range of motion. No edema and no tenderness.  Lymphadenopathy: No  lymphadenopathy noted, cervical, inguinal or axillary Neuro: Alert. Normal reflexes, muscle tone coordination. No cranial nerve deficit. Skin: Skin is warm and dry. No rash noted. Not diaphoretic. No erythema. No pallor. Psychiatric: Normal mood and affect. Behavior, judgment, thought content normal.  No results found for this basename: WBC, HGB, HCT, MCV, PLT   No results found for this basename: CREATININE, BUN, NA, K, CL, CO2    Lab Results  Component Value Date   HGBA1C 11.6* 05/09/2012   Lipid Panel  No results found for this basename: chol, trig, hdl, cholhdl, vldl, ldlcalc       Assessment and plan:   Patient Active Problem List   Diagnosis Date Noted  . Diabetes mellitus type 2, insulin dependent 01/06/2013  . GERD (gastroesophageal reflux disease) 01/06/2013    Plan: Refill insulin Lantus 50 units each bedtime Omeprazole 20 mg capsule by mouth daily Patient has been extensively counseled about nutrition and exercise Patient is to maintain a blood sugar log and bring to clinic to visit  Labs: Comprehensive metabolic panel CBC differential Lipid panel Urine microalbumin Microalbumin creatinine ratio Hemoglobin A1c      Follow up in 2 months or when necessary   The patient was given clear instructions to go to ER or return to medical center if symptoms don't improve, worsen or new problems develop. The patient verbalized understanding. The patient was told to call to get lab results if they haven't heard anything in the next week.     Jeanann Lewandowsky, MD, MHA, [facility] [place], [place] [phone]   01/06/2013, 9:53 AM

## 2013-01-06 NOTE — Progress Notes (Signed)
Pt is here to establish care. Pt is here for a regular check up requesting a physical. Pt needs a refill for his medications. Pt reports that for 4 months he is having stomach aches when he gets hungry.

## 2013-01-06 NOTE — Patient Instructions (Signed)
Diet for Gastroesophageal Reflux Disease, Adult Reflux (acid reflux) is when acid from your stomach flows up into the esophagus. When acid comes in contact with the esophagus, the acid causes irritation and soreness (inflammation) in the esophagus. When reflux happens often or so severely that it causes damage to the esophagus, it is called gastroesophageal reflux disease (GERD). Nutrition therapy can help ease the discomfort of GERD. FOODS OR DRINKS TO AVOID OR LIMIT  Smoking or chewing tobacco. Nicotine is one of the most potent stimulants to acid production in the gastrointestinal tract.  Caffeinated and decaffeinated coffee and black tea.  Regular or low-calorie carbonated beverages or energy drinks (caffeine-free carbonated beverages are allowed).   Strong spices, such as black pepper, white pepper, red pepper, cayenne, curry powder, and chili powder.  Peppermint or spearmint.  Chocolate.  High-fat foods, including meats and fried foods. Extra added fats including oils, butter, salad dressings, and nuts. Limit these to less than 8 tsp per day.  Fruits and vegetables if they are not tolerated, such as citrus fruits or tomatoes.  Alcohol.  Any food that seems to aggravate your condition. If you have questions regarding your diet, call your caregiver or a registered dietitian. OTHER THINGS THAT MAY HELP GERD INCLUDE:   Eating your meals slowly, in a relaxed setting.  Eating 5 to 6 small meals per day instead of 3 large meals.  Eliminating food for a period of time if it causes distress.  Not lying down until 3 hours after eating a meal.  Keeping the head of your bed raised 6 to 9 inches (15 to 23 cm) by using a foam wedge or blocks under the legs of the bed. Lying flat may make symptoms worse.  Being physically active. Weight loss may be helpful in reducing reflux in overweight or obese adults.  Wear loose fitting clothing EXAMPLE MEAL PLAN This meal plan is approximately  2,000 calories based on https://www.bernard.org/ meal planning guidelines. Breakfast   cup cooked oatmeal.  1 cup strawberries.  1 cup low-fat milk.  1 oz almonds. Snack  1 cup cucumber slices.  6 oz yogurt (made from low-fat or fat-free milk). Lunch  2 slice whole-wheat bread.  2 oz sliced [place].  2 tsp mayonnaise.  1 cup blueberries.  1 cup snap peas. Snack  6 whole-wheat crackers.  1 oz string cheese. Dinner   cup brown rice.  1 cup mixed veggies.  1 tsp olive oil.  3 oz grilled fish. Document Released: 02/20/2005 Document Revised: 05/15/2011 Document Reviewed: 01/06/2011 [employer] [facility] Patient Information 2014 [place], [place]. Diabetes and Exercise Regular exercise is important and can help:   Control blood glucose (sugar).  Decrease blood pressure.    Control blood lipids (cholesterol, triglycerides).  Improve overall health. BENEFITS FROM EXERCISE  Improved fitness.  Improved flexibility.  Improved endurance.  Increased bone density.  Weight control.  Increased muscle strength.  Decreased body fat.  Improvement of the body's use of insulin, a hormone.  Increased insulin sensitivity.  Reduction of insulin needs.  Reduced stress and tension.  Helps you feel better. People with diabetes who add exercise to their lifestyle gain additional benefits, including:  Weight loss.  Reduced appetite.  Improvement of the body's use of blood glucose.  Decreased risk factors for heart disease:  Lowering of cholesterol and triglycerides.  Raising the level of good cholesterol (high-density lipoproteins, HDL).  Lowering blood sugar.  Decreased blood pressure. TYPE 1 DIABETES AND EXERCISE  Exercise will usually lower your blood  glucose.  If blood glucose is greater than 240 mg/dl, check urine ketones. If ketones are present, do not exercise.  Location of the insulin injection sites may need to be adjusted with exercise. Avoid injecting  insulin into areas of the body that will be exercised. For example, avoid injecting insulin into:  The arms when playing tennis.  The legs when jogging. For more information, discuss this with your caregiver.  Keep a record of:  Food intake.  Type and amount of exercise.  Expected peak times of insulin action.  Blood glucose levels. Do this before, during, and after exercise. Review your records with your caregiver. This will help you to develop guidelines for adjusting food intake and insulin amounts.  TYPE 2 DIABETES AND EXERCISE  Regular physical activity can help control blood glucose.  Exercise is important because it may:  Increase the body's sensitivity to insulin.  Improve blood glucose control.  Exercise reduces the risk of heart disease. It decreases serum cholesterol and triglycerides. It also lowers blood pressure.  Those who take insulin or oral hypoglycemic agents should watch for signs of hypoglycemia. These signs include dizziness, shaking, sweating, chills, and confusion.  Body water is lost during exercise. It must be replaced. This will help to avoid loss of body fluids (dehydration) or heat stroke. Be sure to talk to your caregiver before starting an exercise program to make sure it is safe for you. Remember, any activity is better than none.  Document Released: 05/13/2003 Document Revised: 05/15/2011 Document Reviewed: 08/27/2008 [facility] Patient Information 2014 [place], [place].

## 2013-01-06 NOTE — Telephone Encounter (Signed)
Dawn called patient  Was given script for vials when he has been Using pens-ok to dispense pens

## 2013-01-07 LAB — MICROALBUMIN / CREATININE URINE RATIO: Microalb, Ur: 0.5 mg/dL (ref 0.00–1.89)

## 2013-01-08 ENCOUNTER — Other Ambulatory Visit: Payer: Self-pay

## 2013-01-08 MED ORDER — ESOMEPRAZOLE MAGNESIUM 40 MG PO CPDR: 40.0000 mg | DELAYED_RELEASE_CAPSULE | Freq: Every day | ORAL | Status: DC

## 2013-03-10 ENCOUNTER — Ambulatory Visit: Payer: Managed Care, Other (non HMO) | Attending: Internal Medicine | Admitting: Internal Medicine

## 2013-03-10 VITALS — BP 116/80 | HR 81 | Temp 98.7°F | Resp 14 | Ht 68.0 in | Wt 188.2 lb

## 2013-03-10 DIAGNOSIS — Z794 Long term (current) use of insulin: Secondary | ICD-10-CM

## 2013-03-10 DIAGNOSIS — E1310 Other specified diabetes mellitus with ketoacidosis without coma: Secondary | ICD-10-CM

## 2013-03-10 DIAGNOSIS — E1110 Type 2 diabetes mellitus with ketoacidosis without coma: Secondary | ICD-10-CM

## 2013-03-10 DIAGNOSIS — E119 Type 2 diabetes mellitus without complications: Secondary | ICD-10-CM | POA: Insufficient documentation

## 2013-03-10 DIAGNOSIS — K219 Gastro-esophageal reflux disease without esophagitis: Secondary | ICD-10-CM

## 2013-03-10 MED ORDER — INSULIN GLARGINE 100 UNIT/ML ~~LOC~~ SOLN: 50.0000 [IU] | Freq: Every day | SUBCUTANEOUS | Status: DC

## 2013-03-10 NOTE — Addendum Note (Signed)
Addended by: Susie CassetteABROL MD, Germain OsgoodNAYANA on: 03/10/2013 10:06 AM   Modules accepted: Orders

## 2013-03-10 NOTE — Progress Notes (Signed)
Patient ID: Jake Miller, male   DOB: 11/19/1970, 43 y.o.   MRN: [id number]   CC:  HPI: 43 year old male here for followup of his diabetes, his last A1c was 10.7. The patient reports CBGs which are low in the morning sometimes 85-90. He checks his sugar only once a day. He does not report any neurologicglycopenic  symptoms. He has never checked her sugar before bedtime    No Known Allergies Past Medical History  Diagnosis Date  . Diabetes mellitus    Current Outpatient Prescriptions on File Prior to Visit  Medication Sig Dispense Refill  . glucose blood test strip Check your sugar in the morning before you eat breakfast, and one hour after a meal.  100 each  12  . insulin glargine (LANTUS) 100 UNIT/ML injection Inject 0.5 mLs (50 Units total) into the skin at bedtime.  10 mL  12  . esomeprazole (NEXIUM) 40 MG capsule Take 1 capsule (40 mg total) by mouth daily.  30 capsule  3   No current facility-administered medications on file prior to visit.   No family history on file. History   Social History  . Marital Status: Married    Spouse Name: N/A    Number of Children: N/A  . Years of Education: N/A   Occupational History  . Not on file.   Social History Main Topics  . Smoking status: Never Smoker   . Smokeless tobacco: Not on file  . Alcohol Use: No  . Drug Use: No  . Sexual Activity: Yes   Other Topics Concern  . Not on file   Social History Narrative  . No narrative on file    Review of Systems  Constitutional: Negative for fever, chills, diaphoresis, activity change, appetite change and fatigue.  HENT: Negative for ear pain, nosebleeds, congestion, facial swelling, rhinorrhea, neck pain, neck stiffness and ear discharge.   Eyes: Negative for pain, discharge, redness, itching and visual disturbance.  Respiratory: Negative for cough, choking, chest tightness, shortness of breath, wheezing and stridor.   Cardiovascular: Negative for chest pain, palpitations and leg  swelling.  Gastrointestinal: Negative for abdominal distention.  Genitourinary: Negative for dysuria, urgency, frequency, hematuria, flank pain, decreased urine volume, difficulty urinating and dyspareunia.  Musculoskeletal: Negative for back pain, joint swelling, arthralgias and gait problem.  Neurological: Negative for dizziness, tremors, seizures, syncope, facial asymmetry, speech difficulty, weakness, light-headedness, numbness and headaches.  Hematological: Negative for adenopathy. Does not bruise/bleed easily.  Psychiatric/Behavioral: Negative for hallucinations, behavioral problems, confusion, dysphoric mood, decreased concentration and agitation.    Objective:   Filed Vitals:   03/10/13 0945  BP: 116/80  Pulse: 81  Temp: 98.7 F (37.1 C)  Resp: 14    Physical Exam  Constitutional: Appears well-developed and well-nourished. No distress.  HENT: Normocephalic. External right and left ear normal. Oropharynx is clear and moist.  Eyes: Conjunctivae and EOM are normal. PERRLA, no scleral icterus.  Neck: Normal ROM. Neck supple. No JVD. No tracheal deviation. No thyromegaly.  CVS: RRR, S1/S2 +, no murmurs, no gallops, no carotid bruit.  Pulmonary: Effort and breath sounds normal, no stridor, rhonchi, wheezes, rales.  Abdominal: Soft. BS +,  no distension, tenderness, rebound or guarding.  Musculoskeletal: Normal range of motion. No edema and no tenderness.  Lymphadenopathy: No lymphadenopathy noted, cervical, inguinal. Neuro: Alert. Normal reflexes, muscle tone coordination. No cranial nerve deficit. Skin: Skin is warm and dry. No rash noted. Not diaphoretic. No erythema. No pallor.  Psychiatric: Normal mood and affect. Behavior,  judgment, thought content normal.   Lab Results  Component Value Date   WBC 9.0 01/06/2013   HGB 16.0 01/06/2013   HCT 44.8 01/06/2013   MCV 82.1 01/06/2013   PLT 293 01/06/2013   Lab Results  Component Value Date   CREATININE 0.98 01/06/2013   BUN 18  01/06/2013   NA 137 01/06/2013   K 4.6 01/06/2013   CL 100 01/06/2013   CO2 28 01/06/2013    Lab Results  Component Value Date   HGBA1C 10.7* 01/06/2013   Lipid Panel     Component Value Date/Time   CHOL 188 01/06/2013 1000   TRIG 208* 01/06/2013 1000   HDL 36* 01/06/2013 1000   CHOLHDL 5.2 01/06/2013 1000   VLDL 42* 01/06/2013 1000   LDLCALC 110* 01/06/2013 1000       Assessment and plan:   Patient Active Problem List   Diagnosis Date Noted  . Diabetes mellitus type 2, insulin dependent 01/06/2013  . GERD (gastroesophageal reflux disease) 01/06/2013       Diabetes, insulin-dependent Patient is definitely uncontrolled Without looking at his CBG at least 3 times a day it would be hard to make a decision about how to adjust his insulin I have recommended the patient to check his CBG 3 times a day,write them down He will return in one week with these numbers I suspect that the patient will have to split his Lantus to 30 units in the morning, 30 units in the evening for better glycemic control     The patient was given clear instructions to go to ER or return to medical center if symptoms don't improve, worsen or new problems develop. The patient verbalized understanding. The patient was told to call to get any lab results if not heard anything in the next week.

## 2013-03-10 NOTE — Progress Notes (Signed)
Pt is here for a follow up visit. Pt feels good today with no complaints.

## 2013-03-17 ENCOUNTER — Other Ambulatory Visit: Payer: No Typology Code available for payment source

## 2013-06-20 ENCOUNTER — Other Ambulatory Visit: Payer: Self-pay | Admitting: Emergency Medicine

## 2013-06-20 MED ORDER — ESOMEPRAZOLE MAGNESIUM 40 MG PO CPDR: 40.0000 mg | DELAYED_RELEASE_CAPSULE | Freq: Every day | ORAL | Status: DC

## 2014-02-21 ENCOUNTER — Ambulatory Visit (INDEPENDENT_AMBULATORY_CARE_PROVIDER_SITE_OTHER): Payer: Managed Care, Other (non HMO) | Admitting: Physician Assistant

## 2014-02-21 VITALS — BP 116/78 | HR 78 | Temp 98.3°F | Resp 16 | Ht 68.0 in | Wt 182.0 lb

## 2014-02-21 DIAGNOSIS — M79601 Pain in right arm: Secondary | ICD-10-CM

## 2014-02-21 DIAGNOSIS — Z794 Long term (current) use of insulin: Secondary | ICD-10-CM

## 2014-02-21 DIAGNOSIS — E118 Type 2 diabetes mellitus with unspecified complications: Secondary | ICD-10-CM

## 2014-02-21 DIAGNOSIS — J069 Acute upper respiratory infection, unspecified: Secondary | ICD-10-CM

## 2014-02-21 DIAGNOSIS — E119 Type 2 diabetes mellitus without complications: Secondary | ICD-10-CM

## 2014-02-21 DIAGNOSIS — M79604 Pain in right leg: Secondary | ICD-10-CM

## 2014-02-21 LAB — COMPREHENSIVE METABOLIC PANEL
ALBUMIN: 4.3 g/dL (ref 3.5–5.2)
ALT: 18 U/L (ref 0–53)
AST: 16 U/L (ref 0–37)
Alkaline Phosphatase: 71 U/L (ref 39–117)
BUN: 15 mg/dL (ref 6–23)
CALCIUM: 9.9 mg/dL (ref 8.4–10.5)
CHLORIDE: 99 meq/L (ref 96–112)
CO2: 25 mEq/L (ref 19–32)
CREATININE: 1 mg/dL (ref 0.50–1.35)
Glucose, Bld: 300 mg/dL — ABNORMAL HIGH (ref 70–99)
POTASSIUM: 4.8 meq/L (ref 3.5–5.3)
Sodium: 135 mEq/L (ref 135–145)
Total Bilirubin: 0.6 mg/dL (ref 0.2–1.2)
Total Protein: 7.4 g/dL (ref 6.0–8.3)

## 2014-02-21 LAB — POCT URINALYSIS DIPSTICK
Bilirubin, UA: NEGATIVE
Blood, UA: NEGATIVE
KETONES UA: NEGATIVE
LEUKOCYTES UA: NEGATIVE
Nitrite, UA: NEGATIVE
PROTEIN UA: NEGATIVE
Spec Grav, UA: <=1.005
Urobilinogen, UA: 0.2
pH, UA: 5

## 2014-02-21 LAB — POCT GLYCOSYLATED HEMOGLOBIN (HGB A1C): HEMOGLOBIN A1C: 9.6

## 2014-02-21 LAB — LIPID PANEL
CHOLESTEROL: 170 mg/dL (ref 0–200)
HDL: 32 mg/dL — ABNORMAL LOW (ref >39)
LDL Cholesterol: 106 mg/dL — ABNORMAL HIGH (ref 0–99)
Total CHOL/HDL Ratio: 5.3 Ratio
Triglycerides: 158 mg/dL — ABNORMAL HIGH (ref <150)
VLDL: 32 mg/dL (ref 0–40)

## 2014-02-21 LAB — GLUCOSE, POCT (MANUAL RESULT ENTRY): POC GLUCOSE: 314 mg/dL — AB (ref 70–99)

## 2014-02-21 MED ORDER — MELOXICAM 15 MG PO TABS: 15.0000 mg | ORAL_TABLET | Freq: Every day | ORAL | Status: DC

## 2014-02-21 MED ORDER — IPRATROPIUM BROMIDE 0.03 % NA SOLN: 2.0000 | Freq: Two times a day (BID) | NASAL | Status: DC

## 2014-02-21 MED ORDER — METFORMIN HCL 500 MG PO TABS: 1000.0000 mg | ORAL_TABLET | Freq: Two times a day (BID) | ORAL | Status: DC

## 2014-02-21 MED ORDER — GUAIFENESIN ER 1200 MG PO TB12: 1.0000 | ORAL_TABLET | Freq: Two times a day (BID) | ORAL | Status: DC | PRN

## 2014-02-21 MED ORDER — INSULIN GLARGINE 100 UNIT/ML SOLOSTAR PEN: 50.0000 [IU] | PEN_INJECTOR | Freq: Every day | SUBCUTANEOUS | Status: DC

## 2014-02-21 NOTE — Progress Notes (Signed)
Subjective:    Patient ID: Jake Miller, male    DOB: 02/20/1971, 43 y.o.   MRN: [id number]  HPI Patient presents for sore throat, medication refill of Lantus, and right arm pain.  Sore throat has been present for 1 week and is worse at night. Is accompanied by ear pressure, and left sided HA. Denies fever, rhinorrhea, and congestion. Is able to eat and swallow without difficulty. Son was sick last week, but has improved. Denies h/o asthma or allergies. Has tried NightQuil without relief. Is not allergic to any medications.  Would like a refill of Lantus 100 U/mL. Takes 50 units nightly. Unsure of type I versus II diagnosis. Diagnosed at 43 years old. Was initiated on Metformin for 1 week and told that it will not work for him and changed to Humulin from 2005-2008. Was then switched to Lantus. Usually gets medication from health clinic and prescription help programs. Has a father and uncle who are both lean and have diabetes. Type unsure, but father died in diabetic coma. Morning glucose readings 100-110 and evening readings in the 250s. Does not believe that he has seen endocrinologist in the past. [place] been in [place] and has adopted a more western diet. Has sandwiches with whole wheat bread for lunch daily. Drinks a lot of water and has 16 oz diet soda daily.  Has had right arm pain for 1 month that does not radiate. Denies loss of function/sensation, numbness, weakness, or change in extremities. Denies trauma or falls. 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 Only feels pain when arm is behind back or raised in a bent position. Has not tried anything for pain relief.   Review of Systems  Constitutional: Negative for fever, activity change, appetite change and fatigue.  HENT: Positive for ear pain (pressure) and sore throat. Negative for congestion, ear discharge, postnasal drip, rhinorrhea, sinus pressure and sneezing.   Eyes: Positive for  redness and itching. Negative for photophobia, pain, discharge and visual disturbance.  Respiratory: Negative for cough, chest tightness and shortness of breath.   Cardiovascular: Negative for chest pain, palpitations and leg swelling.  Gastrointestinal: Negative for nausea, vomiting and abdominal pain.  Endocrine: Negative for polyphagia and polyuria.  Musculoskeletal: Positive for myalgias (right arm). Negative for back pain, joint swelling, arthralgias, neck pain and neck stiffness.  Allergic/Immunologic: Negative for environmental allergies and food allergies.  Neurological: Positive for headaches. Negative for dizziness, weakness, light-headedness and numbness.  Hematological: Negative for adenopathy.       Objective:   Physical Exam  Constitutional: He is oriented to person, place, and time. He appears well-developed and well-nourished. No distress.  Blood pressure 116/78, pulse 78, temperature 98.3 F (36.8 C), resp. rate 16, height 5\' 8"  (1.727 m), weight 182 lb (82.555 kg), SpO2 98 %.  HENT:  Head: Normocephalic and atraumatic.  Right Ear: External ear normal.  Left Ear: External ear normal.  Eyes: Conjunctivae are normal. Pupils are equal, round, and reactive to light. Right eye exhibits no discharge. Left eye exhibits no discharge. No scleral icterus.    Neck: Normal range of motion. Neck supple. No thyromegaly present.  Cardiovascular: Normal rate, regular rhythm and normal heart sounds.  Exam reveals no gallop and no friction rub.   No murmur heard. Pulmonary/Chest: Effort normal and breath sounds normal. No respiratory distress. He has no wheezes. He has no rhonchi. He has no rales.  Abdominal: Soft. Bowel sounds are normal. He  exhibits no distension. There is no tenderness. There is no rebound and no guarding.  Musculoskeletal: Normal range of motion. He exhibits no edema.       Right shoulder: Normal.       Right elbow: Normal.      Right wrist: Normal.       Right  upper arm: He exhibits no tenderness, no bony tenderness, no swelling, no edema, no deformity and no laceration.       Right forearm: Normal.       Right hand: Normal.  Pain in R bicep and forearm muscle not worsened by palpation. Negative: Empty Can, Drop Arm, Hawkin's, and Neer's testing. Painful on right bicep with touching lower back and when reaching upward.  Lymphadenopathy:    He has no cervical adenopathy.  Neurological: He is alert and oriented to person, place, and time. He has normal strength and normal reflexes. He displays no atrophy. No cranial nerve deficit or sensory deficit. He exhibits normal muscle tone. Coordination normal.  Skin: Skin is warm and dry. No rash noted. He is not diaphoretic. No erythema. No pallor.   Results for orders placed or performed in visit on 02/21/14  POCT urinalysis dipstick  Result Value Ref Range   Color, UA yellow    Clarity, UA clear    Glucose, UA >=1000    Bilirubin, UA neg    Ketones, UA neg    Spec Grav, UA <=1.005    Blood, UA neg    pH, UA 5.0    Protein, UA neg    Urobilinogen, UA 0.2    Nitrite, UA neg    Leukocytes, UA Negative   POCT glucose (manual entry)  Result Value Ref Range   POC Glucose 314 (A) 70 - 99 mg/dl  POCT glycosylated hemoglobin (Hb A1C)  Result Value Ref Range   Hemoglobin A1C 9.6        Assessment & Plan:  1. Diabetes mellitus type 2, insulin dependent  Continue Lantus as usually. Metformin ordered, but may not need depending on insulin level. To initiate Metformin, 1 tablet with breakfast and dinner for 1 week, then on week 2 take 2 tablets with breakfast and dinner until complete physical on March 31, 2014.  May have to send a referral to endocrinology depending on the labs. If second oral medication needs to be ordered will, start on Invokana. Will initiate ASA and lisinopril if type 2 is determined.  - POCT urinalysis dipstick - POCT glucose (manual entry) - POCT glycosylated hemoglobin (Hb  A1C) - Microalbumin, urine - Lipid panel - Comprehensive metabolic panel - Insulin, random - Insulin Glargine (LANTUS SOLOSTAR) 100 UNIT/ML Solostar Pen; Inject 50 Units into the skin daily at 10 pm.  Dispense: 5 pen; Refill: 0 - metFORMIN (GLUCOPHAGE) 500 MG tablet; Take 2 tablets (1,000 mg total) by mouth 2 (two) times daily with a meal.  Dispense: 180 tablet; Refill: 0  2. Acute upper respiratory infection Drink plenty of water with Mucinex and get plenty of rest. - ipratropium (ATROVENT) 0.03 % nasal spray; Place 2 sprays into both nostrils 2 (two) times daily.  Dispense: 30 mL; Refill: 0 - Guaifenesin (MUCINEX MAXIMUM STRENGTH) 1200 MG TB12; Take 1 tablet (1,200 mg total) by mouth every 12 (twelve) hours as needed.  Dispense: 14 tablet; Refill: 1 - Wrote note to return to work 02/23/14.  3. Arm pain, anterior, right Will start conservatively. PE negative however, possible impingement. Will refer to PT if no improvement. -  meloxicam (MOBIC) 15 MG tablet; Take 1 tablet (15 mg total) by mouth daily.  Dispense: 30 tablet; Refill: 1   Glennis Montenegro PA-C  [facility] 02/21/2014 12:17 PM

## 2014-02-21 NOTE — Patient Instructions (Signed)
1. Continue Lantus as usually. 2. I am going to order Metformin, but do not pick it up until I call you with your lab results. If you do have to start Metformin, you will take 1 tablet with breakfast and dinner for 1 week, then on week 2 take 2 tablets with breakfast and dinner until I see you next. May have to send a referral to endocrinology depending on the labs. 3. Will return to clinic in one month, for a complete physical and to adjust medications if need be.

## 2014-02-22 LAB — MICROALBUMIN, URINE: Microalb, Ur: 0.2 mg/dL (ref <2.0)

## 2014-02-22 LAB — INSULIN, RANDOM: INSULIN: 50.7 u[IU]/mL — AB (ref 2.0–19.6)

## 2014-02-22 NOTE — Progress Notes (Signed)
The patient was discussed with me and I agree with the diagnosis and treatment plan.  

## 2014-02-25 ENCOUNTER — Telehealth: Payer: Self-pay | Admitting: Physician Assistant

## 2014-02-25 NOTE — Telephone Encounter (Signed)
Spoke to patient about elevated lipids and decided on lifestyle modifications and checking level again in 3 months. He is taking metformin and had one episode of upset stomach. Morning numbers still good. Evening numbers low 200s. Will refer to endocrine if not improved by appt in January.

## 2014-03-31 ENCOUNTER — Encounter: Payer: Self-pay | Admitting: Family Medicine

## 2014-04-01 ENCOUNTER — Other Ambulatory Visit: Payer: Self-pay | Admitting: Internal Medicine

## 2014-04-01 ENCOUNTER — Ambulatory Visit (INDEPENDENT_AMBULATORY_CARE_PROVIDER_SITE_OTHER): Payer: Managed Care, Other (non HMO) | Admitting: Physician Assistant

## 2014-04-01 ENCOUNTER — Encounter: Payer: Self-pay | Admitting: Physician Assistant

## 2014-04-01 VITALS — BP 112/92 | HR 76 | Temp 98.3°F | Resp 16 | Ht 67.0 in | Wt 185.0 lb

## 2014-04-01 DIAGNOSIS — I152 Hypertension secondary to endocrine disorders: Secondary | ICD-10-CM | POA: Insufficient documentation

## 2014-04-01 DIAGNOSIS — K219 Gastro-esophageal reflux disease without esophagitis: Secondary | ICD-10-CM

## 2014-04-01 DIAGNOSIS — I10 Essential (primary) hypertension: Secondary | ICD-10-CM

## 2014-04-01 DIAGNOSIS — Z794 Long term (current) use of insulin: Secondary | ICD-10-CM

## 2014-04-01 DIAGNOSIS — E1159 Type 2 diabetes mellitus with other circulatory complications: Secondary | ICD-10-CM | POA: Insufficient documentation

## 2014-04-01 DIAGNOSIS — E119 Type 2 diabetes mellitus without complications: Secondary | ICD-10-CM

## 2014-04-01 LAB — GLUCOSE, POCT (MANUAL RESULT ENTRY): POC Glucose: 170 mg/dl — AB (ref 70–99)

## 2014-04-01 MED ORDER — HYDROCHLOROTHIAZIDE 12.5 MG PO CAPS: 12.5000 mg | ORAL_CAPSULE | Freq: Every day | ORAL | Status: DC

## 2014-04-01 MED ORDER — ESOMEPRAZOLE MAGNESIUM 40 MG PO CPDR: 40.0000 mg | DELAYED_RELEASE_CAPSULE | Freq: Every day | ORAL | Status: DC

## 2014-04-01 MED ORDER — INSULIN GLARGINE 100 UNIT/ML SOLOSTAR PEN: 50.0000 [IU] | PEN_INJECTOR | Freq: Every day | SUBCUTANEOUS | Status: DC

## 2014-04-01 NOTE — Patient Instructions (Signed)
1. Restart Metformin. 1 pill in the morning and 1 pill at night. 2. Start HCTZ (blood pressure). 1 time every day.  3. Call next week with home blood sugar levels.  4. Cut back on carbs (breads, pastas, rice, potatoes, fries). 5. Cut back on eating out as much. 6. Go for walks 30 minutes, 4-5 times each week.

## 2014-04-01 NOTE — Progress Notes (Signed)
If metf continues to be a problem, lantus may be enough For htn in this case think ACE first since you know it is rec for all DM as renal protective--hct 2nd even tho his ethnicity suggests hct will be most potent

## 2014-04-01 NOTE — Progress Notes (Signed)
   Subjective:    Patient ID: Jake Miller, male    DOB: 09/03/1970, 44 y.o.   MRN: [id number]  HPI Patient presents for follow up after starting Metformin 02/2014. Patient only took medication for 1 week as he was having stomach cramping. He was taking 2 pill twice daily and did not titrate up. Denies N/V or D/C. Continues Lantus 50 units and needs refills. Morning glucose readings ranging from 80-100s. Did not make any changes to diet or add exercising. NKDA.  BP elevated in office again. Never dx with HTN in past. Denies SOB, CP, edema, HA/dizziness.   Review of Systems  Constitutional: Negative for fever, activity change and fatigue.  Respiratory: Negative for cough and shortness of breath.   Cardiovascular: Negative for chest pain, palpitations and leg swelling.  Gastrointestinal: Negative for nausea, vomiting, abdominal pain, diarrhea and constipation.  Neurological: Negative for dizziness and headaches.       Objective:   Physical Exam  Constitutional: He is oriented to person, place, and time. He appears well-developed and well-nourished. No distress.  Blood pressure 112/92, pulse 76, temperature 98.3 F (36.8 C), temperature source Oral, resp. rate 16, height 5\' 7"  (1.702 m), weight 185 lb (83.915 kg), SpO2 98 %.  HENT:  Head: Normocephalic and atraumatic.  Eyes: Conjunctivae are normal. Right eye exhibits no discharge. Left eye exhibits no discharge.  Neck: Neck supple. No thyromegaly present.  Cardiovascular: Normal rate, regular rhythm and normal heart sounds.  Exam reveals no gallop and no friction rub.   No murmur heard. Pulmonary/Chest: Effort normal and breath sounds normal. No respiratory distress. He has no wheezes. He has no rales.  Abdominal: Soft. Bowel sounds are normal.  Musculoskeletal: He exhibits no edema.  Lymphadenopathy:    He has no cervical adenopathy.  Neurological: He is alert and oriented to person, place, and time.  Skin: Skin is warm and dry. No  rash noted. He is not diaphoretic. No erythema. No pallor.   Patient did not have time for diabetic foot exam, but will do on next appt.  Results for orders placed or performed in visit on 04/01/14  POCT glucose (manual entry)  Result Value Ref Range   POC Glucose 170 (A) 70 - 99 mg/dl      Assessment & Plan:  1. Diabetes mellitus type 2, insulin dependent Uncontrolled. Will restart Metformin 500 mg po 1 tab BID. Discussed diet and changes to work on. To call in next week with glucose numbers and to see if ready for increase in Metformin. Went over side effects again. - POCT glucose (manual entry) - Insulin Glargine (LANTUS SOLOSTAR) 100 UNIT/ML Solostar Pen; Inject 50 Units into the skin daily at 10 pm.  Dispense: 5 pen; Refill: 0  2. Essential hypertension Discussed diet and exercise. Discussed side effects of new medication. RTC in 4-6 weeks to see how doing on medication. - hydrochlorothiazide (MICROZIDE) 12.5 MG capsule; Take 1 capsule (12.5 mg total) by mouth daily.  Dispense: 30 capsule; Refill: 1  3. Gastroesophageal reflux disease, esophagitis presence not specified Controlled with medication.  - esomeprazole (NEXIUM) 40 MG capsule; Take 1 capsule (40 mg total) by mouth daily.  Dispense: 30 capsule; Refill: 3   Christeen Lai PA-C  [facility] 04/01/2014 2:42 PM

## 2014-05-07 ENCOUNTER — Telehealth: Payer: Self-pay

## 2014-05-07 DIAGNOSIS — E119 Type 2 diabetes mellitus without complications: Secondary | ICD-10-CM

## 2014-05-07 DIAGNOSIS — Z794 Long term (current) use of insulin: Principal | ICD-10-CM

## 2014-05-07 NOTE — Telephone Encounter (Signed)
Patient request for a refill on Lantus Solostar 100 Unit ML. [facility] [phone]

## 2014-05-07 NOTE — Telephone Encounter (Signed)
Is there a reason why he only received one refill at his office visit. Does he need to call in his number before refill? Please advise.  Assessment & Plan:  1. Diabetes mellitus type 2, insulin dependent Uncontrolled. Will restart Metformin 500 mg po 1 tab BID. Discussed diet and changes to work on. To call in next week with glucose numbers and to see if ready for increase in Metformin. Went over side effects again. - POCT glucose (manual entry) - Insulin Glargine (LANTUS SOLOSTAR) 100 UNIT/ML Solostar Pen; Inject 50 Units into the skin daily at 10 pm. Dispense: 5 pen; Refill: 0

## 2014-05-08 MED ORDER — INSULIN GLARGINE 100 UNIT/ML SOLOSTAR PEN: 50.0000 [IU] | PEN_INJECTOR | Freq: Every day | SUBCUTANEOUS | Status: DC

## 2014-05-08 NOTE — Telephone Encounter (Signed)
I wanted to patient to call with home glucose numbers to see if we needed to increase metformin. You can send refill #4 refills for patient. Please remind him of his up coming appt.

## 2014-05-08 NOTE — Telephone Encounter (Signed)
Sent in Rx. I called pt to let him know and reminded him of his appt. Pt understood.

## 2014-06-10 ENCOUNTER — Ambulatory Visit: Payer: Managed Care, Other (non HMO) | Admitting: Physician Assistant

## 2014-06-10 ENCOUNTER — Other Ambulatory Visit: Payer: Self-pay | Admitting: Physician Assistant

## 2014-08-17 ENCOUNTER — Encounter: Payer: Self-pay | Admitting: Physician Assistant

## 2014-08-17 ENCOUNTER — Ambulatory Visit (INDEPENDENT_AMBULATORY_CARE_PROVIDER_SITE_OTHER): Payer: Managed Care, Other (non HMO) | Admitting: Physician Assistant

## 2014-08-17 VITALS — BP 112/73 | HR 75 | Temp 98.3°F | Resp 16 | Ht 67.0 in | Wt 184.2 lb

## 2014-08-17 DIAGNOSIS — M79604 Pain in right leg: Secondary | ICD-10-CM | POA: Diagnosis not present

## 2014-08-17 DIAGNOSIS — B351 Tinea unguium: Secondary | ICD-10-CM

## 2014-08-17 DIAGNOSIS — M79601 Pain in right arm: Secondary | ICD-10-CM

## 2014-08-17 DIAGNOSIS — E119 Type 2 diabetes mellitus without complications: Secondary | ICD-10-CM

## 2014-08-17 DIAGNOSIS — Z794 Long term (current) use of insulin: Secondary | ICD-10-CM

## 2014-08-17 DIAGNOSIS — K219 Gastro-esophageal reflux disease without esophagitis: Secondary | ICD-10-CM | POA: Diagnosis not present

## 2014-08-17 DIAGNOSIS — I10 Essential (primary) hypertension: Secondary | ICD-10-CM | POA: Diagnosis not present

## 2014-08-17 LAB — COMPREHENSIVE METABOLIC PANEL WITH GFR
ALT: 22 U/L (ref 0–53)
AST: 17 U/L (ref 0–37)
Albumin: 4.3 g/dL (ref 3.5–5.2)
Alkaline Phosphatase: 74 U/L (ref 39–117)
BUN: 12 mg/dL (ref 6–23)
CO2: 29 meq/L (ref 19–32)
Calcium: 9.6 mg/dL (ref 8.4–10.5)
Chloride: 101 meq/L (ref 96–112)
Creat: 0.93 mg/dL (ref 0.50–1.35)
Glucose, Bld: 200 mg/dL — ABNORMAL HIGH (ref 70–99)
Potassium: 4.9 meq/L (ref 3.5–5.3)
Sodium: 137 meq/L (ref 135–145)
Total Bilirubin: 0.6 mg/dL (ref 0.2–1.2)
Total Protein: 7.6 g/dL (ref 6.0–8.3)

## 2014-08-17 LAB — GLUCOSE, POCT (MANUAL RESULT ENTRY): POC Glucose: 203 mg/dL — AB (ref 70–99)

## 2014-08-17 LAB — POCT GLYCOSYLATED HEMOGLOBIN (HGB A1C): Hemoglobin A1C: 8

## 2014-08-17 MED ORDER — ESOMEPRAZOLE MAGNESIUM 40 MG PO CPDR: 40.0000 mg | DELAYED_RELEASE_CAPSULE | Freq: Every day | ORAL | Status: DC

## 2014-08-17 MED ORDER — GLUCOSE BLOOD VI STRP: ORAL_STRIP | Status: DC

## 2014-08-17 MED ORDER — TERBINAFINE HCL 250 MG PO TABS: 250.0000 mg | ORAL_TABLET | Freq: Every day | ORAL | Status: DC

## 2014-08-17 MED ORDER — CYCLOBENZAPRINE HCL 5 MG PO TABS: 10.0000 mg | ORAL_TABLET | Freq: Every day | ORAL | Status: DC

## 2014-08-17 MED ORDER — INSULIN GLARGINE 100 UNIT/ML SOLOSTAR PEN: 50.0000 [IU] | PEN_INJECTOR | Freq: Every day | SUBCUTANEOUS | Status: DC

## 2014-08-17 MED ORDER — LISINOPRIL 10 MG PO TABS: 10.0000 mg | ORAL_TABLET | Freq: Every day | ORAL | Status: DC

## 2014-08-17 MED ORDER — METFORMIN HCL 500 MG PO TABS: 1000.0000 mg | ORAL_TABLET | Freq: Two times a day (BID) | ORAL | Status: DC

## 2014-08-17 NOTE — Progress Notes (Signed)
Subjective:    Patient ID: Jake Miller, Jake    DOB: 02-11-1971, 44 y.o.   MRN: [id number]  HPI Patient presents for DM and HTN follow ups and request refills enough to cover him while he is out of the country. Going to [place], his home country, for 1 month and does not want to run out of refills. Had been maintaining a better diet, however, currently it is Ramadan so he is not eating from 4:30 am until sundown (approx 8-9). Decreased Lantus to 45 units per night. Has been keeping track of his glucose: am 80-90 and pm 100-120. Has started exercising and is playing soccer Saturday and Sunday of every week. Has been compliant with all medication.   Ok with switching HCTZ to lisinopril for HTN to protect kidneys.     Review of Systems  Constitutional: Negative for fever and fatigue.  Respiratory: Negative for shortness of breath.   Cardiovascular: Negative for chest pain, palpitations and leg swelling.  Gastrointestinal: Negative for nausea, vomiting and abdominal pain.  Neurological: Negative for dizziness, light-headedness and headaches.       Objective:   Physical Exam  Constitutional: He is oriented to person, place, and time. He appears well-developed and well-nourished. No distress.  Blood pressure 112/73, pulse 75, temperature 98.3 F (36.8 C), temperature source Oral, resp. rate 16, height  (1.702 m), weight 184 lb 3.2 oz (83.553 kg), SpO2 99 %.   HENT:  Head: Normocephalic and atraumatic.  Right Ear: External ear normal.  Left Ear: External ear normal.  Eyes: Conjunctivae are normal. Right eye exhibits no discharge. Left eye exhibits no discharge. No scleral icterus.  Cardiovascular: Normal rate, regular rhythm and normal heart sounds.  Exam reveals no gallop and no friction rub.   No murmur heard. Pulmonary/Chest: Effort normal and breath sounds normal. No respiratory distress. He has no wheezes. He has no rales.  Neurological: He is alert and oriented to person, place,  and time.  Skin: Skin is warm and dry. No rash noted. He is not diaphoretic. No erythema. No pallor.  Psychiatric: He has a normal mood and affect. His behavior is normal. Judgment and thought content normal.   Results for orders placed or performed in visit on 08/17/14  POCT glucose (manual entry)  Result Value Ref Range   POC Glucose 203 (A) 70 - 99 mg/dl  POCT glycosylated hemoglobin (Hb A1C)  Result Value Ref Range   Hemoglobin A1C 8.0    Diabetic Foot Exam - Simple   Simple Foot Form  Diabetic Foot exam was performed with the following findings:  Yes 08/17/2014  2:19 PM  Visual Inspection  Sensation Testing  Pulse Check  Comments        Assessment & Plan:  1. Diabetes mellitus type 2, insulin dependent - HM Diabetes Foot Exam - metFORMIN (GLUCOPHAGE) 500 MG tablet; Take 2 tablets (1,000 mg total) by mouth 2 (two) times daily with a meal.  Dispense: 180 tablet; Refill: 0 - POCT glucose (manual entry) - POCT glycosylated hemoglobin (Hb A1C) - Comprehensive metabolic panel - Insulin Glargine (LANTUS SOLOSTAR) 100 UNIT/ML Solostar Pen; Inject 50 Units into the skin daily at 10 pm.  Dispense: 10 pen; Refill: 1 - glucose blood test strip; Check your sugar in the morning before you eat breakfast, and one hour after a meal.  Dispense: 100 each; Refill: 12  2. Essential hypertension Discontinue HCTZ.  - lisinopril (PRINIVIL,ZESTRIL) 10 MG tablet; Take 1 tablet (10 mg total) by  mouth daily.  Dispense: 90 tablet; Refill: 3 - Comprehensive metabolic panel  3. Gastroesophageal reflux disease, esophagitis presence not specified - esomeprazole (NEXIUM) 40 MG capsule; Take 1 capsule (40 mg total) by mouth daily.  Dispense: 30 capsule; Refill: 3  4. Arm pain, anterior, right - cyclobenzaprine (FLEXERIL) 5 MG tablet; Take 2 tablets (10 mg total) by mouth at bedtime.  Dispense: 30 tablet; Refill: 0  5. Onychomycosis RTC to check liver enzymes. - terbinafine (LAMISIL) 250 MG tablet; Take  1 tablet (250 mg total) by mouth daily.  Dispense: 30 tablet; Refill: 3   Jake Ransford PA-C  [facility] 08/17/2014 8:13 PM

## 2014-08-18 ENCOUNTER — Encounter: Payer: Self-pay | Admitting: Physician Assistant

## 2014-08-20 ENCOUNTER — Other Ambulatory Visit: Payer: Self-pay

## 2014-08-20 DIAGNOSIS — E119 Type 2 diabetes mellitus without complications: Secondary | ICD-10-CM

## 2014-08-20 DIAGNOSIS — Z794 Long term (current) use of insulin: Principal | ICD-10-CM

## 2014-08-20 MED ORDER — GLUCOSE BLOOD VI STRP: ORAL_STRIP | Status: DC

## 2014-08-20 MED ORDER — ONETOUCH LANCETS MISC: Status: DC

## 2014-08-20 MED ORDER — BLOOD GLUCOSE MONITOR KIT: PACK | Status: AC

## 2014-12-21 ENCOUNTER — Ambulatory Visit: Payer: Managed Care, Other (non HMO) | Admitting: Physician Assistant

## 2014-12-24 ENCOUNTER — Ambulatory Visit (INDEPENDENT_AMBULATORY_CARE_PROVIDER_SITE_OTHER): Payer: Managed Care, Other (non HMO) | Admitting: Urgent Care

## 2014-12-24 ENCOUNTER — Encounter: Payer: Self-pay | Admitting: Urgent Care

## 2014-12-24 VITALS — BP 119/75 | HR 82 | Temp 98.1°F | Resp 16 | Ht 67.5 in | Wt 181.2 lb

## 2014-12-24 DIAGNOSIS — I10 Essential (primary) hypertension: Secondary | ICD-10-CM | POA: Diagnosis not present

## 2014-12-24 DIAGNOSIS — S99912A Unspecified injury of left ankle, initial encounter: Secondary | ICD-10-CM

## 2014-12-24 DIAGNOSIS — Z794 Long term (current) use of insulin: Secondary | ICD-10-CM

## 2014-12-24 DIAGNOSIS — E119 Type 2 diabetes mellitus without complications: Secondary | ICD-10-CM

## 2014-12-24 LAB — POCT GLYCOSYLATED HEMOGLOBIN (HGB A1C): Hemoglobin A1C: 10.6

## 2014-12-24 LAB — COMPLETE METABOLIC PANEL WITH GFR
ALBUMIN: 3.9 g/dL (ref 3.6–5.1)
ALK PHOS: 60 U/L (ref 40–115)
ALT: 22 U/L (ref 9–46)
AST: 20 U/L (ref 10–40)
BILIRUBIN TOTAL: 0.7 mg/dL (ref 0.2–1.2)
BUN: 9 mg/dL (ref 7–25)
CO2: 24 mmol/L (ref 20–31)
Calcium: 9.4 mg/dL (ref 8.6–10.3)
Chloride: 104 mmol/L (ref 98–110)
Creat: 0.83 mg/dL (ref 0.60–1.35)
GFR, Est Non African American: >89 mL/min (ref >=60)
GLUCOSE: 171 mg/dL — AB (ref 65–99)
Potassium: 4.4 mmol/L (ref 3.5–5.3)
Sodium: 137 mmol/L (ref 135–146)
TOTAL PROTEIN: 7.2 g/dL (ref 6.1–8.1)

## 2014-12-24 LAB — HEMOGLOBIN A1C: Hgb A1c MFr Bld: 10.6 % — AB (ref 4.0–6.0)

## 2014-12-24 MED ORDER — CYCLOBENZAPRINE HCL 5 MG PO TABS: 10.0000 mg | ORAL_TABLET | Freq: Every day | ORAL | Status: DC

## 2014-12-24 MED ORDER — SITAGLIPTIN PHOSPHATE 100 MG PO TABS: 100.0000 mg | ORAL_TABLET | Freq: Every day | ORAL | Status: DC

## 2014-12-24 MED ORDER — NAPROXEN SODIUM 550 MG PO TABS: 550.0000 mg | ORAL_TABLET | Freq: Two times a day (BID) | ORAL | Status: DC

## 2014-12-24 NOTE — Patient Instructions (Addendum)
Diabetes Mellitus and Food It is important for you to manage your blood sugar (glucose) level. Your blood glucose level can be greatly affected by what you eat. Eating healthier foods in the appropriate amounts throughout the day at about the same time each day will help you control your blood glucose level. It can also help slow or prevent worsening of your diabetes mellitus. Healthy eating may even help you improve the level of your blood pressure and reach or maintain a healthy weight.  General recommendations for healthful eating and cooking habits include:  Eating meals and snacks regularly. Avoid going long periods of time without eating to lose weight.  Eating a diet that consists mainly of plant-based foods, such as fruits, vegetables, nuts, legumes, and whole grains.  Using low-heat cooking methods, such as baking, instead of high-heat cooking methods, such as deep frying. Work with your dietitian to make sure you understand how to use the Nutrition Facts information on food labels. HOW CAN FOOD AFFECT ME? Carbohydrates Carbohydrates affect your blood glucose level more than any other type of food. Your dietitian will help you determine how many carbohydrates to eat at each meal and teach you how to count carbohydrates. Counting carbohydrates is important to keep your blood glucose at a healthy level, especially if you are using insulin or taking certain medicines for diabetes mellitus. Alcohol Alcohol can cause sudden decreases in blood glucose (hypoglycemia), especially if you use insulin or take certain medicines for diabetes mellitus. Hypoglycemia can be a life-threatening condition. Symptoms of hypoglycemia (sleepiness, dizziness, and disorientation) are similar to symptoms of having too much alcohol.  If your health care provider has given you approval to drink alcohol, do so in moderation and use the following guidelines:  Women should not have more than one drink per day, and men  should not have more than two drinks per day. One drink is equal to:  12 oz of beer.  5 oz of wine.  1 oz of hard liquor.  Do not drink on an empty stomach.  Keep yourself hydrated. Have water, diet soda, or unsweetened iced tea.  Regular soda, juice, and other mixers might contain a lot of carbohydrates and should be counted. WHAT FOODS ARE NOT RECOMMENDED? As you make food choices, it is important to remember that all foods are not the same. Some foods have fewer nutrients per serving than other foods, even though they might have the same number of calories or carbohydrates. It is difficult to get your body what it needs when you eat foods with fewer nutrients. Examples of foods that you should avoid that are high in calories and carbohydrates but low in nutrients include:  Trans fats (most processed foods list trans fats on the Nutrition Facts label).  Regular soda.  Juice.  Candy.  Sweets, such as cake, pie, doughnuts, and cookies.  Fried foods. WHAT FOODS CAN I EAT? Eat nutrient-rich foods, which will nourish your body and keep you healthy. The food you should eat also will depend on several factors, including:  The calories you need.  The medicines you take.  Your weight.  Your blood glucose level.  Your blood pressure level.  Your cholesterol level. You should eat a variety of foods, including:  Protein.  Lean cuts of meat.  Proteins low in saturated fats, such as fish, egg whites, and beans. Avoid processed meats.  Fruits and vegetables.  Fruits and vegetables that may help control blood glucose levels, such as apples, mangoes, and   yams.  Dairy products.  Choose fat-free or low-fat dairy products, such as milk, yogurt, and cheese.  Grains, bread, pasta, and rice.  Choose whole grain products, such as multigrain bread, whole oats, and brown rice. These foods may help control blood pressure.  Fats.  Foods containing healthful fats, such as nuts,  avocado, olive oil, canola oil, and fish. DOES EVERYONE WITH DIABETES MELLITUS HAVE THE SAME MEAL PLAN? Because every person with diabetes mellitus is different, there is not one meal plan that works for everyone. It is very important that you meet with a dietitian who will help you create a meal plan that is just right for you.   This information is not intended to replace advice given to you by your health care provider. Make sure you discuss any questions you have with your health care provider.   Document Released: 11/17/2004 Document Revised: 03/13/2014 Document Reviewed: 01/17/2013 Elsevier Interactive Patient Education 2016 Else[employer] Inc[employer]    RICE for Routine Care of Injuries Theroutine careofmanyinjuriesincludes rest, ice, compression, and elevation (RICE therapy). RICE therapy is often recommended for injuries to soft tissues, such as a muscle strain, ligament injuries, bruises, and overuse injuries. It can also be used for some bony injuries. Using RICE therapy can help to relieve pain, lessen swelling, and enable your body to heal. Rest Rest is required to allow your body to heal. This usually involves reducing your normal activities and avoiding use of the injured part of your body. Generally, you can return to your normal activities when you are comfortable and have been given permission by your health care provider. Ice Icing your injury helps to keep the swelling down, and it lessens pain. Do not apply ice directly to your skin.  Put ice in a plastic bag.  Place a towel between your skin and the bag.  Leave the ice on for 20 minutes, 2-3 times a day. Do this for as long as you are directed by your health care provider. Compression Compression means putting pressure on the injured area. Compression helps to keep swelling down, gives support, and helps with discomfort. Compression may be done with an elastic bandage. If an elastic bandage has been applied, follow these  general tips:  Remove and reapply the bandage every 3-4 hours or as directed by your health care provider.  Make sure the bandage is not wrapped too tightly, because this can cut off circulation. If part of your body beyond the bandage becomes blue, numb, cold, swollen, or more painful, your bandage is most likely too tight. If this occurs, remove your bandage and reapply it more loosely.  See your health care provider if the bandage seems to be making your problems worse rather than better. Elevation Elevation means keeping the injured area raised. This helps to lessen swelling and decrease pain. If possible, your injured area should be elevated at or above the level of your heart or the center of your chest. WHEN SHOULD I SEEK MEDICAL CARE? You should seek medical care if:  Your pain and swelling continue.  Your symptoms are getting worse rather than improving. These symptoms may indicate that further evaluation or further X-rays are needed. Sometimes, X-rays may not show a small broken bone (fracture) until a number of days later. Make a follow-up appointment with your health care provider. WHEN SHOULD I SEEK IMMEDIATE MEDICAL CARE? You should seek immediate medical care if:  You have sudden severe pain at or below the area of your injury.  You  have redness or increased swelling around your injury.  You have tingling or numbness at or below the area of your injury that does not improve after you remove the elastic bandage.   This information is not intended to replace advice given to you by your health care provider. Make sure you discuss any questions you have with your health care provider.   Document Released: 06/04/2000 Document Revised: 11/11/2014 Document Reviewed: 01/28/2014 Elsevier Interactive Patient Education [employer]

## 2014-12-24 NOTE — Progress Notes (Signed)
    MRN: [id number] DOB: 12-17-1970  Subjective:   Jake Miller is a 44 y.o. male presenting for follow up on follow up on diabetes and new ankle problem.   DM - taking Metformin $RemoveBeforeDE'1000mg'YpMmgcjiQKbGams$  daily. Also takes insulin 50 units nightly. Diet is mostly compliant, cut back on sugary foods. Limits his carbs. Started playing soccer for exercise. Denies polydipsia, polyuria, chest pain, shob, n/v, abdominal pain, numbness or tingling, cuts or wounds that won't heal. Denies smoking cigarettes or drinking alcohol.  Ankle - reports 5 day history of left ankle injury while playing soccer. Patient ran into another player, made impact with his left foot. Patient has left ankle pain and swelling since. He has tried icing, Bengay, ibuprofen with minimal relief. Denies any other aggravating or relieving factors, no other questions or concerns.  Woodrow has a current medication list which includes the following prescription(s): blood glucose meter kit and supplies, cyclobenzaprine, esomeprazole, glucose blood, insulin glargine, lisinopril, metformin, one touch lancets, and terbinafine. Also has No Known Allergies.  Cliford  has a past medical history of Diabetes mellitus; Hypertension; and GERD (gastroesophageal reflux disease). Also  has no past surgical history on file.  Objective:   Vitals: BP 119/75 mmHg  Pulse 82  Temp(Src) 98.1 F (36.7 C) (Oral)  Resp 16  Ht 5' 7.5" (1.715 m)  Wt 181 lb 3.2 oz (82.192 kg)  BMI 27.94 kg/m2  Physical Exam  Constitutional: He is oriented to person, place, and time. He appears well-developed and well-nourished.  HENT:  Mouth/Throat: Oropharynx is clear and moist.  Eyes: Right eye exhibits no discharge. Left eye exhibits no discharge. No scleral icterus.  Neck: Normal range of motion. Neck supple.  Cardiovascular: Normal rate, regular rhythm and intact distal pulses.  Exam reveals no gallop and no friction rub.   No murmur heard. Pulmonary/Chest: No respiratory  distress. He has no wheezes. He has no rales.  Abdominal: Soft. Bowel sounds are normal. He exhibits no distension and no mass. There is no tenderness.  Musculoskeletal:       Left ankle: He exhibits swelling. He exhibits normal range of motion, no ecchymosis, no deformity and no laceration. Tenderness. Lateral malleolus, medial malleolus and AITFL tenderness found. No CF ligament, no posterior TFL, no head of 5th metatarsal and no proximal fibula tenderness found. Achilles tendon exhibits no pain and no defect.  Neurological: He is alert and oriented to person, place, and time.  Skin: Skin is warm and dry. No rash noted. No erythema. No pallor.  Psychiatric: He has a normal mood and affect.   Results for orders placed or performed in visit on 12/24/14 (from the past 24 hour(s))  POCT glycosylated hemoglobin (Hb A1C)     Status: None   Collection Time: 12/24/14  1:49 PM  Result Value Ref Range   Hemoglobin A1C 10.6    Assessment and Plan :   1. Diabetes mellitus type 2, insulin dependent ([place]) - Start DPP4 inhibitor, continue Metformin and insulin. Patient cannot tolerate 1,$RemoveBeforeDEI'000mg'IinDXfuHrzOSusDm$  of Metformin BID. Advised dietary modifications and continued exercise. RTC in 3 months for follow up.  2. Essential hypertension - Controlled, labs pending  3. Left ankle injury, initial encounter - Patient prefers to wait on x-ray. Will try conservative management first, RICE method. Anaprox and Flexeril. RTC in 1-2 weeks if no improvement.  Jaynee Eagles, PA-C [facility] [phone] 12/24/2014 1:04 PM

## 2014-12-25 ENCOUNTER — Encounter: Payer: Self-pay | Admitting: Family Medicine

## 2014-12-28 ENCOUNTER — Encounter: Payer: Self-pay | Admitting: Urgent Care

## 2014-12-28 ENCOUNTER — Encounter: Payer: Self-pay | Admitting: Family Medicine

## 2015-01-14 ENCOUNTER — Encounter: Payer: Self-pay | Admitting: Urgent Care

## 2015-01-14 ENCOUNTER — Ambulatory Visit (INDEPENDENT_AMBULATORY_CARE_PROVIDER_SITE_OTHER): Payer: Managed Care, Other (non HMO) | Admitting: Urgent Care

## 2015-01-14 VITALS — BP 101/70 | HR 87 | Temp 98.3°F | Resp 16 | Ht 67.0 in | Wt 178.0 lb

## 2015-01-14 DIAGNOSIS — S93402D Sprain of unspecified ligament of left ankle, subsequent encounter: Secondary | ICD-10-CM

## 2015-01-14 DIAGNOSIS — K219 Gastro-esophageal reflux disease without esophagitis: Secondary | ICD-10-CM | POA: Diagnosis not present

## 2015-01-14 DIAGNOSIS — Z794 Long term (current) use of insulin: Secondary | ICD-10-CM | POA: Diagnosis not present

## 2015-01-14 DIAGNOSIS — E119 Type 2 diabetes mellitus without complications: Secondary | ICD-10-CM | POA: Diagnosis not present

## 2015-01-14 MED ORDER — ESOMEPRAZOLE MAGNESIUM 40 MG PO CPDR: 40.0000 mg | DELAYED_RELEASE_CAPSULE | Freq: Every day | ORAL | Status: DC

## 2015-01-14 MED ORDER — GLUCOSE BLOOD VI STRP: ORAL_STRIP | Status: DC

## 2015-01-14 NOTE — Progress Notes (Signed)
    MRN: [id number] DOB: [birth year]  Subjective:   Jake Miller is a 44 y.o. male presenting for follow up on left ankle injury and refills.   Left ankle - the patient was initially seen on 12/24/2014, he had rolled his left ankle ~3 weeks ago. He declined x-rays, patient opted for conservative management, RICE method, Anaprox and Flexeril, wore his ankle brace and modified his activity as best he could. Today, patient reports significant improvement with his ankle pain and swelling. He denies instability, ankle pain, swelling, bruising, bony deformity and limping. He stopped using Anaprox several days ago due to stomach upset.   Refills - patient reports that he would like to refill his Nexium for GERD and stomach upset with use of Anaprox. He no longer has stomach pain. He would also like to refill his glucose test strips. He checks his fasting blood sugar and another postprandial. His last diabetes check was on 12/24/2014, he was started on Januvia at his last visit. Plans on following up for his diabetes in 3 months.  Denies any other aggravating or relieving factors, no other questions or concerns.  Jake Miller has a current medication list which includes the following prescription(s): esomeprazole, insulin glargine, lisinopril, metformin, sitagliptin, blood glucose meter kit and supplies, cyclobenzaprine, glucose blood, and one touch lancets. Also has No Known Allergies.  Jake Miller  has a past medical history of Diabetes mellitus; Hypertension; and GERD (gastroesophageal reflux disease). Also  has no past surgical history on file.  Objective:   Vitals: BP 101/70 mmHg  Pulse 87  Temp(Src) 98.3 F (36.8 C) (Oral)  Resp 16  Ht _0  (1.702 m)  Wt 178 lb (80.74 kg)  BMI 27.87 kg/m2  Physical Exam  Constitutional: He is oriented to person, place, and time. He appears well-developed and well-nourished.  HENT:  Mouth/Throat: Oropharynx is clear and moist.  Eyes: No scleral icterus.    Cardiovascular: Normal rate, regular rhythm and intact distal pulses.  Exam reveals no gallop and no friction rub.   No murmur heard. Pulmonary/Chest: No respiratory distress. He has no wheezes. He has no rales.  Abdominal: Soft. Bowel sounds are normal. He exhibits no distension and no mass. There is no tenderness.  Musculoskeletal:       Left ankle: He exhibits swelling (trace edema over medial malleolus). He exhibits normal range of motion, no ecchymosis, no deformity, no laceration and normal pulse. No tenderness.  Neurological: He is alert and oriented to person, place, and time.  Skin: Skin is warm and dry. No rash noted. No erythema. No pallor.   Assessment and Plan :   1. Left ankle sprain, subsequent encounter - Anticipatory guidance provided, patient declined ankle x-rays again today. Counseled patient that he needs to rehabilitate his ankle, reviewed exercises with him and provided instructions.  2. Gastroesophageal reflux disease, esophagitis presence not specified - Stop Anaprox, refilled Nexium  3. Diabetes mellitus type 2, insulin dependent (HCC) - Glucose test strips refilled, recheck in 3 months  Jaynee Eagles, PA-C [facility] [phone] 01/14/2015 1:47 PM

## 2015-01-14 NOTE — Patient Instructions (Addendum)
Perform these stretches and exercises 2-3 times per week.  For stretches, hold for 10 seconds each. For exercises/strengthening, perform 10 repetitions for each.    Acute Ankle Sprain With Phase I Rehab An acute ankle sprain is a partial or complete tear in one or more of the ligaments of the ankle due to traumatic injury. The severity of the injury depends on both the number of ligaments sprained and the grade of sprain. There are 3 grades of sprains.   A grade 1 sprain is a mild sprain. There is a slight pull without obvious tearing. There is no loss of strength, and the muscle and ligament are the correct length.  A grade 2 sprain is a moderate sprain. There is tearing of fibers within the substance of the ligament where it connects two bones or two cartilages. The length of the ligament is increased, and there is usually decreased strength.  A grade 3 sprain is a complete rupture of the ligament and is uncommon. In addition to the grade of sprain, there are three types of ankle sprains.  Lateral ankle sprains: This is a sprain of one or more of the three ligaments on the outer side (lateral) of the ankle. These are the most common sprains. Medial ankle sprains: There is one large triangular ligament of the inner side (medial) of the ankle that is susceptible to injury. Medial ankle sprains are less common. Syndesmosis, "high ankle," sprains: The syndesmosis is the ligament that connects the two bones of the lower leg. Syndesmosis sprains usually only occur with very severe ankle sprains. SYMPTOMS  Pain, tenderness, and swelling in the ankle, starting at the side of injury that may progress to the whole ankle and foot with time.  "Pop" or tearing sensation at the time of injury.  Bruising that may spread to the heel.  Impaired ability to walk soon after injury. CAUSES   Acute ankle sprains are caused by trauma placed on the ankle that temporarily forces or pries the anklebone (talus)  out of its normal socket.  Stretching or tearing of the ligaments that normally hold the joint in place (usually due to a twisting injury). RISK INCREASES WITH:  Previous ankle sprain.  Sports in which the foot may land awkwardly (i.e., basketball, volleyball, or soccer) or walking or running on uneven or rough surfaces.  Shoes with inadequate support to prevent sideways motion when stress occurs.  Poor strength and flexibility.  Poor balance skills.  Contact sports. PREVENTION   Warm up and stretch properly before activity.  Maintain physical fitness:  Ankle and leg flexibility, muscle strength, and endurance.  Cardiovascular fitness.  Balance training activities.  Use proper technique and have a coach correct improper technique.  Taping, protective strapping, bracing, or high-top tennis shoes may help prevent injury. Initially, tape is best; however, it loses most of its support function within 10 to 15 minutes.  Wear proper-fitted protective shoes (High-top shoes with taping or bracing is more effective than either alone).  Provide the ankle with support during sports and practice activities for 12 months following injury. PROGNOSIS   If treated properly, ankle sprains can be expected to recover completely; however, the length of recovery depends on the degree of injury.  A grade 1 sprain usually heals enough in 5 to 7 days to allow modified activity and requires an average of 6 weeks to heal completely.  A grade 2 sprain requires 6 to 10 weeks to heal completely.  A grade 3 sprain requires  12 to 16 weeks to heal.  A syndesmosis sprain often takes more than 3 months to heal. RELATED COMPLICATIONS   Frequent recurrence of symptoms may result in a chronic problem. Appropriately addressing the problem the first time decreases the frequency of recurrence and optimizes healing time. Severity of the initial sprain does not predict the likelihood of later  instability.  Injury to other structures (bone, cartilage, or tendon).  A chronically unstable or arthritic ankle joint is a possibility with repeated sprains. TREATMENT Treatment initially involves the use of ice, medication, and compression bandages to help reduce pain and inflammation. Ankle sprains are usually immobilized in a walking cast or boot to allow for healing. Crutches may be recommended to reduce pressure on the injury. After immobilization, strengthening and stretching exercises may be necessary to regain strength and a full range of motion. Surgery is rarely needed to treat ankle sprains. MEDICATION   Nonsteroidal anti-inflammatory medications, such as aspirin and ibuprofen (do not take for the first 3 days after injury or within 7 days before surgery), or other minor pain relievers, such as acetaminophen, are often recommended. Take these as directed by your caregiver. Contact your caregiver immediately if any bleeding, stomach upset, or signs of an allergic reaction occur from these medications.  Ointments applied to the skin may be helpful.  Pain relievers may be prescribed as necessary by your caregiver. Do not take prescription pain medication for longer than 4 to 7 days. Use only as directed and only as much as you need. HEAT AND COLD  Cold treatment (icing) is used to relieve pain and reduce inflammation for acute and chronic cases. Cold should be applied for 10 to 15 minutes every 2 to 3 hours for inflammation and pain and immediately after any activity that aggravates your symptoms. Use ice packs or an ice massage.  Heat treatment may be used before performing stretching and strengthening activities prescribed by your caregiver. Use a heat pack or a warm soak. SEEK IMMEDIATE MEDICAL CARE IF:   Pain, swelling, or bruising worsens despite treatment.  You experience pain, numbness, discoloration, or coldness in the foot or toes.  New, unexplained symptoms develop (drugs  used in treatment may produce side effects.) EXERCISES  PHASE I EXERCISES RANGE OF MOTION (ROM) AND STRETCHING EXERCISES - Ankle Sprain, Acute Phase I, Weeks 1 to 2 These exercises may help you when beginning to restore flexibility in your ankle. You will likely work on these exercises for the 1 to 2 weeks after your injury. Once your physician, physical therapist, or athletic trainer sees adequate progress, he or she will advance your exercises. While completing these exercises, remember:   Restoring tissue flexibility helps normal motion to return to the joints. This allows healthier, less painful movement and activity.  An effective stretch should be held for at least 30 seconds.  A stretch should never be painful. You should only feel a gentle lengthening or release in the stretched tissue. RANGE OF MOTION - Dorsi/Plantar Flexion  While sitting with your right / left knee straight, draw the top of your foot upwards by flexing your ankle. Then reverse the motion, pointing your toes downward.  Hold each position for __________ seconds.  After completing your first set of exercises, repeat this exercise with your knee bent. Repeat __________ times. Complete this exercise __________ times per day.  RANGE OF MOTION - Ankle Alphabet  Imagine your right / left big toe is a pen.  Keeping your hip and knee still,  write out the entire alphabet with your "pen." Make the letters as large as you can without increasing any discomfort. Repeat __________ times. Complete this exercise __________ times per day.  STRENGTHENING EXERCISES - Ankle Sprain, Acute -Phase I, Weeks 1 to 2 These exercises may help you when beginning to restore strength in your ankle. You will likely work on these exercises for 1 to 2 weeks after your injury. Once your physician, physical therapist, or athletic trainer sees adequate progress, he or she will advance your exercises. While completing these exercises, remember:    Muscles can gain both the endurance and the strength needed for everyday activities through controlled exercises.  Complete these exercises as instructed by your physician, physical therapist, or athletic trainer. Progress the resistance and repetitions only as guided.  You may experience muscle soreness or fatigue, but the pain or discomfort you are trying to eliminate should never worsen during these exercises. If this pain does worsen, stop and make certain you are following the directions exactly. If the pain is still present after adjustments, discontinue the exercise until you can discuss the trouble with your clinician. STRENGTH - Dorsiflexors  Secure a rubber exercise band/tubing to a fixed object (i.e., table, pole) and loop the other end around your right / left foot.  Sit on the floor facing the fixed object. The band/tubing should be slightly tense when your foot is relaxed.  Slowly draw your foot back toward you using your ankle and toes.  Hold this position for __________ seconds. Slowly release the tension in the band and return your foot to the starting position. Repeat __________ times. Complete this exercise __________ times per day.  STRENGTH - Plantar-flexors   Sit with your right / left leg extended. Holding onto both ends of a rubber exercise band/tubing, loop it around the ball of your foot. Keep a slight tension in the band.  Slowly push your toes away from you, pointing them downward.  Hold this position for __________ seconds. Return slowly, controlling the tension in the band/tubing. Repeat __________ times. Complete this exercise __________ times per day.  STRENGTH - Ankle Eversion  Secure one end of a rubber exercise band/tubing to a fixed object (table, pole). Loop the other end around your foot just before your toes.  Place your fists between your knees. This will focus your strengthening at your ankle.  Drawing the band/tubing across your opposite  foot, slowly, pull your little toe out and up. Make sure the band/tubing is positioned to resist the entire motion.  Hold this position for __________ seconds. Have your muscles resist the band/tubing as it slowly pulls your foot back to the starting position.  Repeat __________ times. Complete this exercise __________ times per day.  STRENGTH - Ankle Inversion  Secure one end of a rubber exercise band/tubing to a fixed object (table, pole). Loop the other end around your foot just before your toes.  Place your fists between your knees. This will focus your strengthening at your ankle.  Slowly, pull your big toe up and in, making sure the band/tubing is positioned to resist the entire motion.  Hold this position for __________ seconds.  Have your muscles resist the band/tubing as it slowly pulls your foot back to the starting position. Repeat __________ times. Complete this exercises __________ times per day.  STRENGTH - Towel Curls  Sit in a chair positioned on a non-carpeted surface.  Place your right / left foot on a towel, keeping your heel on the  floor.  Pull the towel toward your heel by only curling your toes. Keep your heel on the floor.  If instructed by your physician, physical therapist, or athletic trainer, add weight to the end of the towel. Repeat __________ times. Complete this exercise __________ times per day.   This information is not intended to replace advice given to you by your health care provider. Make sure you discuss any questions you have with your health care provider.   Document Released: 09/21/2004 Document Revised: 03/13/2014 Document Reviewed: 06/04/2008 Elsevier Interactive Patient Education [employer]

## 2015-02-18 ENCOUNTER — Telehealth: Payer: Self-pay

## 2015-02-18 DIAGNOSIS — E119 Type 2 diabetes mellitus without complications: Secondary | ICD-10-CM

## 2015-02-18 DIAGNOSIS — Z794 Long term (current) use of insulin: Principal | ICD-10-CM

## 2015-02-18 MED ORDER — INSULIN GLARGINE 100 UNIT/ML SOLOSTAR PEN
50.0000 [IU] | PEN_INJECTOR | Freq: Every day | SUBCUTANEOUS | Status: DC
Start: 2015-02-18 — End: 2015-02-23

## 2015-02-18 NOTE — Telephone Encounter (Signed)
Pt stated he wants to change his insulin refill supply to 3 months instead.  Please advise  [phone]

## 2015-02-18 NOTE — Telephone Encounter (Signed)
Rx sent in for 3 months.  Pt notified.

## 2015-02-22 ENCOUNTER — Telehealth: Payer: Self-pay

## 2015-02-22 DIAGNOSIS — Z794 Long term (current) use of insulin: Principal | ICD-10-CM

## 2015-02-22 DIAGNOSIS — E119 Type 2 diabetes mellitus without complications: Secondary | ICD-10-CM

## 2015-02-22 NOTE — Telephone Encounter (Signed)
Pt states [facility] [address], states one is the LANTUS 100 UNITS, but that was the only one He gave me, but stated he need all his meds and told we need to look in his chart for the rest. Please call pt at [phone]    [facility] [address]

## 2015-02-23 ENCOUNTER — Telehealth: Payer: Self-pay

## 2015-02-23 DIAGNOSIS — E119 Type 2 diabetes mellitus without complications: Secondary | ICD-10-CM

## 2015-02-23 DIAGNOSIS — Z794 Long term (current) use of insulin: Principal | ICD-10-CM

## 2015-02-23 MED ORDER — INSULIN GLARGINE 100 UNIT/ML SOLOSTAR PEN: 50.0000 [IU] | PEN_INJECTOR | Freq: Every day | SUBCUTANEOUS | Status: DC

## 2015-02-23 NOTE — Telephone Encounter (Signed)
Patient is requesting 90 day supply of lantus (insurance purposes)  He needs this today.  Also, call him and advise when he needs to RTC for additional refills.   [phone]

## 2015-02-23 NOTE — Telephone Encounter (Signed)
I sent the L[facility]us to [facility] [address] but it will be his responsibility to give me the other names of the Rx's he wants. .Marland Kitchen

## 2015-02-24 NOTE — Telephone Encounter (Signed)
Im am confused on the original prescription, did we give him 15 pens last time?

## 2015-02-24 NOTE — Telephone Encounter (Signed)
Looks like [facility] refilled #15 pens yesterday 12/20. He needs to follow up for his diabetes with [facility] in January. 03/25/14 would make 3 months since his last diabetes check.

## 2015-04-15 ENCOUNTER — Ambulatory Visit: Payer: Managed Care, Other (non HMO) | Admitting: Urgent Care

## 2015-04-22 ENCOUNTER — Ambulatory Visit (INDEPENDENT_AMBULATORY_CARE_PROVIDER_SITE_OTHER): Payer: Managed Care, Other (non HMO) | Admitting: Urgent Care

## 2015-04-22 ENCOUNTER — Encounter: Payer: Self-pay | Admitting: Urgent Care

## 2015-04-22 VITALS — BP 113/75 | HR 78 | Temp 98.0°F | Resp 16 | Ht 67.25 in | Wt 184.0 lb

## 2015-04-22 DIAGNOSIS — S99912D Unspecified injury of left ankle, subsequent encounter: Secondary | ICD-10-CM

## 2015-04-22 DIAGNOSIS — E119 Type 2 diabetes mellitus without complications: Secondary | ICD-10-CM | POA: Diagnosis not present

## 2015-04-22 DIAGNOSIS — N529 Male erectile dysfunction, unspecified: Secondary | ICD-10-CM | POA: Diagnosis not present

## 2015-04-22 DIAGNOSIS — M25572 Pain in left ankle and joints of left foot: Secondary | ICD-10-CM | POA: Diagnosis not present

## 2015-04-22 DIAGNOSIS — Z794 Long term (current) use of insulin: Secondary | ICD-10-CM | POA: Diagnosis not present

## 2015-04-22 LAB — COMPREHENSIVE METABOLIC PANEL
ALT: 21 U/L (ref 9–46)
AST: 18 U/L (ref 10–40)
Albumin: 4.2 g/dL (ref 3.6–5.1)
Alkaline Phosphatase: 62 U/L (ref 40–115)
BUN: 13 mg/dL (ref 7–25)
CHLORIDE: 100 mmol/L (ref 98–110)
CO2: 30 mmol/L (ref 20–31)
Calcium: 9.6 mg/dL (ref 8.6–10.3)
Creat: 0.89 mg/dL (ref 0.60–1.35)
GLUCOSE: 308 mg/dL — AB (ref 65–99)
POTASSIUM: 4.8 mmol/L (ref 3.5–5.3)
Sodium: 134 mmol/L — ABNORMAL LOW (ref 135–146)
Total Bilirubin: 0.7 mg/dL (ref 0.2–1.2)
Total Protein: 7.2 g/dL (ref 6.1–8.1)

## 2015-04-22 LAB — POCT GLYCOSYLATED HEMOGLOBIN (HGB A1C): HEMOGLOBIN A1C: 9.9

## 2015-04-22 MED ORDER — METFORMIN HCL 500 MG PO TABS: ORAL_TABLET | ORAL | Status: DC

## 2015-04-22 MED ORDER — MELOXICAM 7.5 MG PO TABS: 7.5000 mg | ORAL_TABLET | Freq: Every day | ORAL | Status: DC

## 2015-04-22 MED ORDER — GLIPIZIDE 5 MG PO TABS: 5.0000 mg | ORAL_TABLET | Freq: Two times a day (BID) | ORAL | Status: DC

## 2015-04-22 MED ORDER — INSULIN GLARGINE 100 UNIT/ML SOLOSTAR PEN: 50.0000 [IU] | PEN_INJECTOR | Freq: Every day | SUBCUTANEOUS | Status: DC

## 2015-04-22 NOTE — Progress Notes (Signed)
MRN: [id number]  Subjective:   Jake Miller is a 45 y.o. male who presents for follow up of Type 2 diabetes mellitus.   Diabetes Diagnosis was made 2005. Patient is currently managed with Metformin and insulin50 units per night. One pen lasts him ~5 days. Unfortunately, his insurance notified him that they will no longer be covering Solostar. He was also supposed to be taking Januvia but stopped this due to stomach discomfort. Denies metallic taste, hypoglycemia. Reports that he has stopped using sugar at all, uses Splenda, minimizes carbs or uses whole grains, drinks diet or Zero sodas. Patient is checking home blood sugars. Home blood sugar records: BGs range between 100 and 130. Patient denies foot ulcerations, nausea, paresthesia of the feet, polydipsia, polyuria, visual disturbances, vomiting, weight loss and chest pain, shob, abdominal pain, polyuria. Patient is checking their feet daily. No foot concerns. Patient cannot recall last diabetic eye exam. Denies smoking or alcohol use.    Ankle - reports intermittent left ankle pain. He previously rolled his ankle and has not wanted to do x-rays. He tried using RICE method and otc NSAID therapy which has helped but does not resolve the issue. He does walk daily most of the day for his work. Denies bony deformity, numbness or tingling, redness, swelling.   Objective:   PHYSICAL EXAM BP 113/75 mmHg  Pulse 78  Temp(Src) 98 F (36.7 C) (Oral)  Resp 16  Ht 5' 7.25" (1.708 m)  Wt 184 lb (83.462 kg)  BMI 28.61 kg/m2  SpO2 97%  Wt Readings from Last 3 Encounters:  04/22/15 184 lb (83.462 kg)  01/14/15 178 lb (80.74 kg)  12/24/14 181 lb 3.2 oz (82.192 kg)   Physical Exam  Constitutional: He is oriented to person, place, and time. He appears well-developed and well-nourished.  HENT:  Mouth/Throat: Oropharynx is clear and moist.  Eyes: No scleral icterus.  Cardiovascular: Normal rate, regular rhythm and intact distal pulses.  Exam  reveals no gallop and no friction rub.   No murmur heard. Pulmonary/Chest: No respiratory distress. He has no wheezes. He has no rales.  Abdominal: Soft. Bowel sounds are normal. He exhibits no distension and no mass. There is no tenderness.  Musculoskeletal:       Left ankle: He exhibits normal range of motion, no swelling, no ecchymosis, no deformity and no laceration. Tenderness. AITFL tenderness found. No lateral malleolus, no medial malleolus, no CF ligament, no posterior TFL, no head of 5th metatarsal and no proximal fibula tenderness found.  Neurological: He is alert and oriented to person, place, and time.  Skin: Skin is warm and dry.   Results for orders placed or performed in visit on 04/22/15 (from the past 24 hour(s))  POCT glycosylated hemoglobin (Hb A1C)     Status: None   Collection Time: 04/22/15 12:56 PM  Result Value Ref Range   Hemoglobin A1C 9.9    Assessment and Plan :   1. Diabetes mellitus type 2, insulin dependent (HCC) - Not controlled, start glipizide, try increasing Metformin to  daily. Counseled on dietary modifications. Patient declined referral to nutritionist, he believes he is compliant with his diet. He will check with his insurance regarding Solostar coverage and call me back. Recheck in 3 months.  2. Left ankle injury, subsequent encounter 3. Left ankle pain - Patient declined x-rays, start meloxicam. If no improvement will consider x-rays, PT.  4. Erectile dysfunction, unspecified erectile dysfunction type - Testosterone; Future   Wallis Bamberg, PA-C [facility] [phone] 04/22/2015 12:11 PM

## 2015-04-22 NOTE — Patient Instructions (Addendum)
- Please check on your insulin coverage with your insurance company. We need basal insulin, currently we are using Solostar pen. Please ask if they can keep covering this and if not which basal insulin they will cover.    Diabetes Mellitus and Food It is important for you to manage your blood sugar (glucose) level. Your blood glucose level can be greatly affected by what you eat. Eating healthier foods in the appropriate amounts throughout the day at about the same time each day will help you control your blood glucose level. It can also help slow or prevent worsening of your diabetes mellitus. Healthy eating may even help you improve the level of your blood pressure and reach or maintain a healthy weight.  General recommendations for healthful eating and cooking habits include:  Eating meals and snacks regularly. Avoid going long periods of time without eating to lose weight.  Eating a diet that consists mainly of plant-based foods, such as fruits, vegetables, nuts, legumes, and whole grains.  Using low-heat cooking methods, such as baking, instead of high-heat cooking methods, such as deep frying. Work with your dietitian to make sure you understand how to use the Nutrition Facts information on food labels. HOW CAN FOOD AFFECT ME? Carbohydrates Carbohydrates affect your blood glucose level more than any other type of food. Your dietitian will help you determine how many carbohydrates to eat at each meal and teach you how to count carbohydrates. Counting carbohydrates is important to keep your blood glucose at a healthy level, especially if you are using insulin or taking certain medicines for diabetes mellitus. Alcohol Alcohol can cause sudden decreases in blood glucose (hypoglycemia), especially if you use insulin or take certain medicines for diabetes mellitus. Hypoglycemia can be a life-threatening condition. Symptoms of hypoglycemia (sleepiness, dizziness, and disorientation) are similar to  symptoms of having too much alcohol.  If your health care provider has given you approval to drink alcohol, do so in moderation and use the following guidelines:  Women should not have more than one drink per day, and men should not have more than two drinks per day. One drink is equal to:  12 oz of beer.  5 oz of wine.  1 oz of hard liquor.  Do not drink on an empty stomach.  Keep yourself hydrated. Have water, diet soda, or unsweetened iced tea.  Regular soda, juice, and other mixers might contain a lot of carbohydrates and should be counted. WHAT FOODS ARE NOT RECOMMENDED? As you make food choices, it is important to remember that all foods are not the same. Some foods have fewer nutrients per serving than other foods, even though they might have the same number of calories or carbohydrates. It is difficult to get your body what it needs when you eat foods with fewer nutrients. Examples of foods that you should avoid that are high in calories and carbohydrates but low in nutrients include:  Trans fats (most processed foods list trans fats on the Nutrition Facts label).  Regular soda.  Juice.  Candy.  Sweets, such as cake, pie, doughnuts, and cookies.  Fried foods. WHAT FOODS CAN I EAT? Eat nutrient-rich foods, which will nourish your body and keep you healthy. The food you should eat also will depend on several factors, including:  The calories you need.  The medicines you take.  Your weight.  Your blood glucose level.  Your blood pressure level.  Your cholesterol level. You should eat a variety of foods, including:  Protein.  Lean cuts of meat.  Proteins low in saturated fats, such as fish, egg whites, and beans. Avoid processed meats.  Fruits and vegetables.  Fruits and vegetables that may help control blood glucose levels, such as apples, mangoes, and yams.  Dairy products.  Choose fat-free or low-fat dairy products, such as milk, yogurt, and  cheese.  Grains, bread, pasta, and rice.  Choose whole grain products, such as multigrain bread, whole oats, and brown rice. These foods may help control blood pressure.  Fats.  Foods containing healthful fats, such as nuts, avocado, olive oil, canola oil, and fish. DOES EVERYONE WITH DIABETES MELLITUS HAVE THE SAME MEAL PLAN? Because every person with diabetes mellitus is different, there is not one meal plan that works for everyone. It is very important that you meet with a dietitian who will help you create a meal plan that is just right for you.   This information is not intended to replace advice given to you by your health care provider. Make sure you discuss any questions you have with your health care provider.   Document Released: 11/17/2004 Document Revised: 03/13/2014 Document Reviewed: 01/17/2013 Elsevier Interactive Patient Education 2016 [employer].    Diabetes and Exercise Exercising regularly is important. It is not just about losing weight. It has many health benefits, such as:  Improving your overall fitness, flexibility, and endurance.  Increasing your bone density.  Helping with weight control.  Decreasing your body fat.  Increasing your muscle strength.  Reducing stress and tension.  Improving your overall health. People with diabetes who exercise gain additional benefits because exercise:  Reduces appetite.  Improves the body's use of blood sugar (glucose).  Helps lower or control blood glucose.  Decreases blood pressure.  Helps control blood lipids (such as cholesterol and triglycerides).  Improves the body's use of the hormone insulin by:  Increasing the body's insulin sensitivity.  Reducing the body's insulin needs.  Decreases the risk for heart disease because exercising:  Lowers cholesterol and triglycerides levels.  Increases the levels of good cholesterol (such as high-density lipoproteins [HDL]) in the body.  Lowers blood  glucose levels. YOUR ACTIVITY PLAN  Choose an activity that you enjoy, and set realistic goals. To exercise safely, you should begin practicing any new physical activity slowly, and gradually increase the intensity of the exercise over time. Your health care provider or diabetes educator can help create an activity plan that works for you. General recommendations include:  Encouraging children to engage in at least 60 minutes of physical activity each day.  Stretching and performing strength training exercises, such as yoga or weight lifting, at least 2 times per week.  Performing a total of at least 150 minutes of moderate-intensity exercise each week, such as brisk walking or water aerobics.  Exercising at least 3 days per week, making sure you allow no more than 2 consecutive days to pass without exercising.  Avoiding long periods of inactivity (90 minutes or more). When you have to spend an extended period of time sitting down, take frequent breaks to walk or stretch. RECOMMENDATIONS FOR EXERCISING WITH TYPE 1 OR TYPE 2 DIABETES   Check your blood glucose before exercising. If blood glucose levels are greater than 240 mg/dL, check for urine ketones. Do not exercise if ketones are present.  Avoid injecting insulin into areas of the body that are going to be exercised. For example, avoid injecting insulin into:  The arms when playing tennis.  The legs when jogging.  Keep  a record of:  Food intake before and after you exercise.  Expected peak times of insulin action.  Blood glucose levels before and after you exercise.  The type and amount of exercise you have done.  Review your records with your health care provider. Your health care provider will help you to develop guidelines for adjusting food intake and insulin amounts before and after exercising.  If you take insulin or oral hypoglycemic agents, watch for signs and symptoms of hypoglycemia. They  include:  Dizziness.  Shaking.  Sweating.  Chills.  Confusion.  Drink plenty of water while you exercise to prevent dehydration or heat stroke. Body water is lost during exercise and must be replaced.  Talk to your health care provider before starting an exercise program to make sure it is safe for you. Remember, almost any type of activity is better than none.   This information is not intended to replace advice given to you by your health care provider. Make sure you discuss any questions you have with your health care provider.   Document Released: 05/13/2003 Document Revised: 07/07/2014 Document Reviewed: 07/30/2012 Elsevier Interactive Patient Education [employer]

## 2015-04-27 ENCOUNTER — Telehealth: Payer: Self-pay

## 2015-04-27 NOTE — Telephone Encounter (Signed)
Pharm faxed notice that ins does not cover lantus any longer. Preferred alternatives are Levemir or tresiba. Kathlene November do you want to change to one of these?

## 2015-04-28 MED ORDER — INSULIN DETEMIR 100 UNIT/ML FLEXPEN: 40.0000 [IU] | PEN_INJECTOR | Freq: Every day | SUBCUTANEOUS | Status: DC

## 2015-04-28 NOTE — Telephone Encounter (Signed)
I will use Levemir. I sent the script to his pharmacy. Please let me know if you need me to do anything more for this.

## 2015-04-28 NOTE — Telephone Encounter (Signed)
I had notified pharm that an alternative would be sent, so they will notify pt of change when Rx is ready.

## 2015-05-21 ENCOUNTER — Other Ambulatory Visit: Payer: Self-pay | Admitting: Urgent Care

## 2015-05-24 LAB — HM DIABETES EYE EXAM

## 2015-05-31 ENCOUNTER — Other Ambulatory Visit: Payer: Self-pay

## 2015-05-31 DIAGNOSIS — E119 Type 2 diabetes mellitus without complications: Secondary | ICD-10-CM

## 2015-05-31 DIAGNOSIS — Z794 Long term (current) use of insulin: Principal | ICD-10-CM

## 2015-05-31 MED ORDER — METFORMIN HCL 500 MG PO TABS: ORAL_TABLET | ORAL | Status: DC

## 2015-05-31 MED ORDER — GLIPIZIDE 5 MG PO TABS: 5.0000 mg | ORAL_TABLET | Freq: Two times a day (BID) | ORAL | Status: DC

## 2015-06-04 ENCOUNTER — Encounter: Payer: Self-pay | Admitting: Urgent Care

## 2015-06-23 ENCOUNTER — Encounter: Payer: Self-pay | Admitting: Family Medicine

## 2015-08-30 ENCOUNTER — Telehealth: Payer: Self-pay

## 2015-08-30 DIAGNOSIS — Z794 Long term (current) use of insulin: Principal | ICD-10-CM

## 2015-08-30 DIAGNOSIS — E119 Type 2 diabetes mellitus without complications: Secondary | ICD-10-CM

## 2015-08-30 NOTE — Telephone Encounter (Signed)
Patient called in need of refill of test strips and insulin.  Would like them sent to [facility] [place] for test strips and [facility] [address] for insulin. [phone]

## 2015-08-31 ENCOUNTER — Other Ambulatory Visit: Payer: Self-pay

## 2015-08-31 DIAGNOSIS — E119 Type 2 diabetes mellitus without complications: Secondary | ICD-10-CM

## 2015-08-31 DIAGNOSIS — Z794 Long term (current) use of insulin: Principal | ICD-10-CM

## 2015-08-31 MED ORDER — GLUCOSE BLOOD VI STRP: ORAL_STRIP | Status: DC

## 2015-09-02 MED ORDER — GLUCOSE BLOOD VI STRP: ORAL_STRIP | Status: DC

## 2015-09-02 NOTE — Telephone Encounter (Signed)
Test strips were re[facility]sent to [facility]. Pt should still have RFs from Feb Rx for Levemir ins at [facility]. Please advise pt.

## 2015-09-06 NOTE — Telephone Encounter (Signed)
Called pt who was aware that both Rxs are ready for him. Also transferred him to clerical staff to check on appt.

## 2015-10-27 ENCOUNTER — Other Ambulatory Visit: Payer: Self-pay

## 2015-10-27 DIAGNOSIS — K219 Gastro-esophageal reflux disease without esophagitis: Secondary | ICD-10-CM

## 2015-10-27 MED ORDER — ESOMEPRAZOLE MAGNESIUM 40 MG PO CPDR: 40.0000 mg | DELAYED_RELEASE_CAPSULE | Freq: Every day | ORAL | 0 refills | Status: DC

## 2015-11-23 ENCOUNTER — Other Ambulatory Visit: Payer: Self-pay | Admitting: Urgent Care

## 2015-12-09 ENCOUNTER — Other Ambulatory Visit: Payer: Self-pay | Admitting: Urgent Care

## 2015-12-09 DIAGNOSIS — K219 Gastro-esophageal reflux disease without esophagitis: Secondary | ICD-10-CM

## 2015-12-21 ENCOUNTER — Other Ambulatory Visit: Payer: Self-pay | Admitting: Urgent Care

## 2015-12-26 NOTE — Telephone Encounter (Signed)
What is follow up plan for diabetes check?

## 2015-12-28 NOTE — Telephone Encounter (Signed)
LMOM for pt that I am sending in one 30 day RF but he will need to RTC within the month for more refills.

## 2016-01-19 ENCOUNTER — Other Ambulatory Visit: Payer: Self-pay | Admitting: Urgent Care

## 2016-01-19 DIAGNOSIS — K219 Gastro-esophageal reflux disease without esophagitis: Secondary | ICD-10-CM

## 2016-02-19 ENCOUNTER — Other Ambulatory Visit: Payer: Self-pay | Admitting: Urgent Care

## 2016-02-19 DIAGNOSIS — Z794 Long term (current) use of insulin: Principal | ICD-10-CM

## 2016-02-19 DIAGNOSIS — E119 Type 2 diabetes mellitus without complications: Secondary | ICD-10-CM

## 2016-02-22 ENCOUNTER — Other Ambulatory Visit: Payer: Self-pay | Admitting: Urgent Care

## 2016-02-23 ENCOUNTER — Telehealth: Payer: Self-pay

## 2016-02-23 NOTE — Telephone Encounter (Signed)
This was sent in earlier today

## 2016-02-23 NOTE — Telephone Encounter (Signed)
[facility]

## 2016-02-23 NOTE — Telephone Encounter (Signed)
04/2015 last ov and labs 

## 2016-02-24 NOTE — Telephone Encounter (Signed)
Advised pt of Mani's advice. Pt agreed and transferred to set up appt.

## 2016-02-24 NOTE — Telephone Encounter (Signed)
Patient needs to come in for a recheck or see his PCP. His diabetes is not controlled and the patient needs consistent follow up with 1 provider to avoid the complications of diabetes. Please inform patient about this and that I will provide 1 courtesy refill for him to come in for an office visit.

## 2016-02-24 NOTE — Telephone Encounter (Signed)
04/2015 last ov and labs 

## 2016-02-26 ENCOUNTER — Ambulatory Visit (INDEPENDENT_AMBULATORY_CARE_PROVIDER_SITE_OTHER): Payer: Managed Care, Other (non HMO) | Admitting: Urgent Care

## 2016-02-26 VITALS — BP 126/82 | HR 83 | Temp 98.0°F | Resp 17 | Ht 67.25 in | Wt 181.0 lb

## 2016-02-26 DIAGNOSIS — Z794 Long term (current) use of insulin: Secondary | ICD-10-CM | POA: Diagnosis not present

## 2016-02-26 DIAGNOSIS — E1165 Type 2 diabetes mellitus with hyperglycemia: Secondary | ICD-10-CM | POA: Diagnosis not present

## 2016-02-26 DIAGNOSIS — E785 Hyperlipidemia, unspecified: Secondary | ICD-10-CM

## 2016-02-26 DIAGNOSIS — IMO0001 Reserved for inherently not codable concepts without codable children: Secondary | ICD-10-CM | POA: Insufficient documentation

## 2016-02-26 LAB — POCT GLYCOSYLATED HEMOGLOBIN (HGB A1C): Hemoglobin A1C: 10.4

## 2016-02-26 MED ORDER — INSULIN GLARGINE 100 UNIT/ML SOLOSTAR PEN: 50.0000 [IU] | PEN_INJECTOR | Freq: Every day | SUBCUTANEOUS | 12 refills | Status: DC

## 2016-02-26 MED ORDER — ATORVASTATIN CALCIUM 10 MG PO TABS: 10.0000 mg | ORAL_TABLET | Freq: Every day | ORAL | 3 refills | Status: DC

## 2016-02-26 MED ORDER — METFORMIN HCL 1000 MG PO TABS: 1000.0000 mg | ORAL_TABLET | Freq: Two times a day (BID) | ORAL | 1 refills | Status: DC

## 2016-02-26 MED ORDER — GLIPIZIDE 5 MG PO TABS: 5.0000 mg | ORAL_TABLET | Freq: Every day | ORAL | 1 refills | Status: DC

## 2016-02-26 NOTE — Patient Instructions (Addendum)
Diabetes Mellitus and Food It is important for you to manage your blood sugar (glucose) level. Your blood glucose level can be greatly affected by what you eat. Eating healthier foods in the appropriate amounts throughout the day at about the same time each day will help you control your blood glucose level. It can also help slow or prevent worsening of your diabetes mellitus. Healthy eating may even help you improve the level of your blood pressure and reach or maintain a healthy weight. General recommendations for healthful eating and cooking habits include:  Eating meals and snacks regularly. Avoid going long periods of time without eating to lose weight.  Eating a diet that consists mainly of plant-based foods, such as fruits, vegetables, nuts, legumes, and whole grains.  Using low-heat cooking methods, such as baking, instead of high-heat cooking methods, such as deep frying.  Work with your dietitian to make sure you understand how to use the Nutrition Facts information on food labels. How can food affect me? Carbohydrates Carbohydrates affect your blood glucose level more than any other type of food. Your dietitian will help you determine how many carbohydrates to eat at each meal and teach you how to count carbohydrates. Counting carbohydrates is important to keep your blood glucose at a healthy level, especially if you are using insulin or taking certain medicines for diabetes mellitus. Alcohol Alcohol can cause sudden decreases in blood glucose (hypoglycemia), especially if you use insulin or take certain medicines for diabetes mellitus. Hypoglycemia can be a life-threatening condition. Symptoms of hypoglycemia (sleepiness, dizziness, and disorientation) are similar to symptoms of having too much alcohol. If your health care provider has given you approval to drink alcohol, do so in moderation and use the following guidelines:  Women should not have more than one drink per day, and men  should not have more than two drinks per day. One drink is equal to: ? 12 oz of beer. ? 5 oz of wine. ? 1 oz of hard liquor.  Do not drink on an empty stomach.  Keep yourself hydrated. Have water, diet soda, or unsweetened iced tea.  Regular soda, juice, and other mixers might contain a lot of carbohydrates and should be counted.  What foods are not recommended? As you make food choices, it is important to remember that all foods are not the same. Some foods have fewer nutrients per serving than other foods, even though they might have the same number of calories or carbohydrates. It is difficult to get your body what it needs when you eat foods with fewer nutrients. Examples of foods that you should avoid that are high in calories and carbohydrates but low in nutrients include:  Trans fats (most processed foods list trans fats on the Nutrition Facts label).  Regular soda.  Juice.  Candy.  Sweets, such as cake, pie, doughnuts, and cookies.  Fried foods.  What foods can I eat? Eat nutrient-rich foods, which will nourish your body and keep you healthy. The food you should eat also will depend on several factors, including:  The calories you need.  The medicines you take.  Your weight.  Your blood glucose level.  Your blood pressure level.  Your cholesterol level.  You should eat a variety of foods, including:  Protein. ? Lean cuts of meat. ? Proteins low in saturated fats, such as fish, egg whites, and beans. Avoid processed meats.  Fruits and vegetables. ? Fruits and vegetables that may help control blood glucose levels, such as apples,   mangoes, and yams.  Dairy products. ? Choose fat-free or low-fat dairy products, such as milk, yogurt, and cheese.  Grains, bread, pasta, and rice. ? Choose whole grain products, such as multigrain bread, whole oats, and brown rice. These foods may help control blood pressure.  Fats. ? Foods containing healthful fats, such as  nuts, avocado, olive oil, canola oil, and fish.  Does everyone with diabetes mellitus have the same meal plan? Because every person with diabetes mellitus is different, there is not one meal plan that works for everyone. It is very important that you meet with a dietitian who will help you create a meal plan that is just right for you. This information is not intended to replace advice given to you by your health care provider. Make sure you discuss any questions you have with your health care provider. Document Released: 11/17/2004 Document Revised: 07/29/2015 Document Reviewed: 01/17/2013 Elsevier Interactive Patient Education  2017 Elsevier Inc.    IF you received an x-ray today, you will receive an invoice from [facility]. Please contact [facility] at [phone] with questions or concerns regarding your invoice.   IF you received labwork today, you will receive an invoice from [facility]. Please contact [facility] at [phone] with questions or concerns regarding your invoice.   Our billing staff will not be able to assist you with questions regarding bills from these companies.  You will be contacted with the lab results as soon as they are available. The fastest way to get your results is to activate your My Chart account. Instructions are located on the last page of this paperwork. If you have not heard from us regarding the results in 2 weeks, please contact this office.      

## 2016-02-26 NOTE — Progress Notes (Signed)
   MRN: [id number]  Subjective:   Jake Miller is a 45 y.o. male who presents for follow up of Type 2 Diabetes Mellitus.   Diagnosis was made 2005. Patient is currently managed with Levemir 50 units at bedtime, Metformin 500mg  once daily, glipizide once daily. Denies adverse effects including metallic taste, hypoglycemia, nausea, vomiting.   Patient is checking home blood sugars. Home blood sugar is generally high 180-190's. However, he averages 120's, 130's. Admits intermittent belly pain associated with spicy foods. Patient denies polydipsia, chest pain, nausea, vomiting, hematuria, polyuria, skin infections, numbness or tingling. Patient is checking their feet daily. Denies foot concerns. Last diabetic eye exam eye exam was 05/2015, will have another recheck soon. Has had some mild blurred vision, was supposed to f/u in 10/2015, he will set up an appointment. Diet is generally diabetic friendly. Patient is exercising.  Known diabetic complications: none  Immunizations: Flu vaccine and pneumococal vaccine refused.  Objective:   PHYSICAL EXAM BP 126/82 (BP Location: Right Arm, Patient Position: Sitting, Cuff Size: Normal)   Pulse 83   Temp 98 F (36.7 C) (Oral)   Resp 17   Ht 5' 7.25" (1.708 m)   Wt 181 lb (82.1 kg)   SpO2 98%   BMI 28.14 kg/m   Physical Exam  Constitutional: He is oriented to person, place, and time. He appears well-developed and well-nourished.  HENT:  Mouth/Throat: Oropharynx is clear and moist.  Cardiovascular: Normal rate, regular rhythm and intact distal pulses.  Exam reveals no gallop and no friction rub.   No murmur heard. Pulmonary/Chest: No respiratory distress. He has no wheezes. He has no rales.  Abdominal: Soft. Bowel sounds are normal. He exhibits no distension and no mass. There is no tenderness. There is no guarding.  Musculoskeletal: He exhibits no edema.  Neurological: He is alert and oriented to person, place, and time.  Skin: Skin is warm  and dry. Capillary refill takes less than 2 seconds.   Diabetic Foot Exam - Simple   Simple Foot Form Visual Inspection No deformities, no ulcerations, no other skin breakdown bilaterally:  Yes Sensation Testing Intact to touch and monofilament testing bilaterally:  Yes Pulse Check Posterior Tibialis and Dorsalis pulse intact bilaterally:  Yes Comments    Results for orders placed or performed in visit on 02/26/16 (from the past 24 hour(s))  POCT glycosylated hemoglobin (Hb A1C)     Status: None   Collection Time: 02/26/16 12:08 PM  Result Value Ref Range   Hemoglobin A1C 10.4    Assessment and Plan :   1. Uncontrolled type 2 diabetes mellitus without complication, with long-term current use of insulin (HCC) - Recommended dietary modifications. Increase Metformin to 1,000mg  BID. Maintain glipizide daily. Refilled lantus at patient's request. Use 50 units for now. Monitor blood sugars and rtc if fasting sugars are below 100. Patient may need insulin dose adjustments at that time. He will consider immunizations. Recheck in 3 months.  2. Hyperlipidemia, unspecified hyperlipidemia type - Labs pending, start atorvastatin.  Wallis BambergMario Omarius Grantham, PA-C [facility] [phone] 02/26/2016 11:49 AM

## 2016-02-27 LAB — CMP14+EGFR
ALBUMIN: 4.4 g/dL (ref 3.5–5.5)
ALK PHOS: 67 IU/L (ref 39–117)
ALT: 30 IU/L (ref 0–44)
AST: 20 IU/L (ref 0–40)
Albumin/Globulin Ratio: 1.6 (ref 1.2–2.2)
BUN / CREAT RATIO: 17 (ref 9–20)
BUN: 16 mg/dL (ref 6–24)
Bilirubin Total: 0.5 mg/dL (ref 0.0–1.2)
CHLORIDE: 100 mmol/L (ref 96–106)
CO2: 23 mmol/L (ref 18–29)
CREATININE: 0.94 mg/dL (ref 0.76–1.27)
Calcium: 9.7 mg/dL (ref 8.7–10.2)
GFR calc Af Amer: 114 mL/min/{1.73_m2} (ref >59)
GFR calc non Af Amer: 98 mL/min/{1.73_m2} (ref >59)
GLUCOSE: 218 mg/dL — AB (ref 65–99)
Globulin, Total: 2.8 g/dL (ref 1.5–4.5)
Potassium: 4.3 mmol/L (ref 3.5–5.2)
Sodium: 140 mmol/L (ref 134–144)
Total Protein: 7.2 g/dL (ref 6.0–8.5)

## 2016-02-27 LAB — LIPID PANEL
CHOLESTEROL TOTAL: 202 mg/dL — AB (ref 100–199)
Chol/HDL Ratio: 6.7 ratio units — ABNORMAL HIGH (ref 0.0–5.0)
HDL: 30 mg/dL — ABNORMAL LOW (ref >39)
LDL Calculated: 130 mg/dL — ABNORMAL HIGH (ref 0–99)
TRIGLYCERIDES: 209 mg/dL — AB (ref 0–149)
VLDL Cholesterol Cal: 42 mg/dL — ABNORMAL HIGH (ref 5–40)

## 2016-03-02 ENCOUNTER — Encounter: Payer: Self-pay | Admitting: Urgent Care

## 2016-03-11 ENCOUNTER — Telehealth: Payer: Self-pay

## 2016-03-11 NOTE — Telephone Encounter (Signed)
Per pharm lantus is not covered, alternatives are basaglar,levimir and treisba. Pt would like a PA on lantus but may need to try these first. Ok to change?

## 2016-03-13 NOTE — Telephone Encounter (Signed)
Patient has tried Levemir and failed treatment. He did much better on Lantus and his a1c is worse than his level in 04/2015. Thank you for doing the research Clydie BraunKaren. Let's try and get this patient Lantus. I appreciate your hard work! Thank you!

## 2016-03-14 NOTE — Telephone Encounter (Signed)
[phone] [employer] started 03/14/16 Stacy pa rep  Id [id number]

## 2016-03-14 NOTE — Telephone Encounter (Signed)
Pa form for non formulary product being faxed to us to fill out

## 2016-03-16 ENCOUNTER — Encounter: Payer: Self-pay | Admitting: Urgent Care

## 2016-03-16 ENCOUNTER — Ambulatory Visit (INDEPENDENT_AMBULATORY_CARE_PROVIDER_SITE_OTHER): Payer: Commercial Managed Care - PPO | Admitting: Urgent Care

## 2016-03-16 VITALS — BP 120/80 | HR 85 | Temp 99.5°F | Resp 18 | Ht 67.25 in | Wt 180.0 lb

## 2016-03-16 DIAGNOSIS — Z794 Long term (current) use of insulin: Secondary | ICD-10-CM

## 2016-03-16 DIAGNOSIS — IMO0001 Reserved for inherently not codable concepts without codable children: Secondary | ICD-10-CM

## 2016-03-16 DIAGNOSIS — R52 Pain, unspecified: Secondary | ICD-10-CM | POA: Diagnosis not present

## 2016-03-16 DIAGNOSIS — R059 Cough, unspecified: Secondary | ICD-10-CM

## 2016-03-16 DIAGNOSIS — R05 Cough: Secondary | ICD-10-CM

## 2016-03-16 DIAGNOSIS — E1165 Type 2 diabetes mellitus with hyperglycemia: Secondary | ICD-10-CM

## 2016-03-16 DIAGNOSIS — R519 Headache, unspecified: Secondary | ICD-10-CM

## 2016-03-16 DIAGNOSIS — R51 Headache: Secondary | ICD-10-CM

## 2016-03-16 MED ORDER — HYDROCODONE-HOMATROPINE 5-1.5 MG/5ML PO SYRP: 5.0000 mL | ORAL_SOLUTION | Freq: Every evening | ORAL | 0 refills | Status: DC | PRN

## 2016-03-16 MED ORDER — BENZONATATE 100 MG PO CAPS: 100.0000 mg | ORAL_CAPSULE | Freq: Three times a day (TID) | ORAL | 0 refills | Status: DC | PRN

## 2016-03-16 NOTE — Progress Notes (Signed)
    MRN: [id number] DOB: 1970/05/10  Subjective:   Jake Miller is a 46 y.o. male presenting for follow up on diabetes and sick visit. Checking fasting blood sugar and has been 110-130's. He did have one reading at 500 last night. Patient is waiting on PA for Lantus, currently using Levemir. Reports 4 day history of fever, headache, body aches, dry cough. Denies congestion, sore throat, chest pain, shob, n/v, abdominal pain, rashes. Has tried DayQuil for his symptoms. Reports that he has been sent home due to his illness. Would like a work note.  Jake Miller has a current medication list which includes the following prescription(s): atorvastatin, blood glucose meter kit and supplies, cyclobenzaprine, glipizide, glucose blood, lisinopril, meloxicam, metformin, one touch lancets, and insulin glargine. Also has No Known Allergies.  Jake Miller  has a past medical history of Diabetes mellitus; GERD (gastroesophageal reflux disease); and Hypertension. Also  has no past surgical history on file.  Objective:   Vitals: BP 120/80 (BP Location: Right Arm, Patient Position: Sitting, Cuff Size: Small)   Pulse 85   Temp 99.5 F (37.5 C) (Oral)   Resp 18   Ht 5' 7.25" (1.708 m)   Wt 180 lb (81.6 kg)   SpO2 98%   BMI 27.98 kg/m   Wt Readings from Last 3 Encounters:  03/16/16 180 lb (81.6 kg)  02/26/16 181 lb (82.1 kg)  04/22/15 184 lb (83.5 kg)   Physical Exam  Constitutional: He is oriented to person, place, and time. He appears well-developed and well-nourished.  HENT:  TM's intact bilaterally, no effusions or erythema. Nasal turbinates pink and dry, nasal passages patent. No sinus tenderness. Oropharynx clear, mucous membranes moist, dentition in good repair.  Eyes: Right eye exhibits no discharge. Left eye exhibits no discharge.  Neck: Normal range of motion. Neck supple.  Cardiovascular: Normal rate, regular rhythm and intact distal pulses.  Exam reveals no gallop and no friction rub.   No  murmur heard. Pulmonary/Chest: No respiratory distress. He has no wheezes. He has no rales.  Abdominal: Soft. Bowel sounds are normal. He exhibits no distension and no mass. There is no tenderness. There is no guarding.  Lymphadenopathy:    He has no cervical adenopathy.  Neurological: He is alert and oriented to person, place, and time.  Skin: Skin is warm and dry.   Assessment and Plan :   1. Nonintractable headache, unspecified chronicity pattern, unspecified headache type 2. Body aches 3. Cough - Suspect influenza. Tamiflu is outside the window of benefit now. I offered supportive care to patient given that his physical exam findings are reassuring. Work note provided for 2 days. RTC if no improvement.  4. Uncontrolled type 2 diabetes mellitus without complication, with long-term current use of insulin (HCC) - PA for Lantus is pending. Continue to check blood sugars, use Levemir. RTC if blood sugar remains elevated.  Jaynee Eagles, PA-C [facility] [phone] 03/16/2016 12:20 PM

## 2016-03-16 NOTE — Patient Instructions (Addendum)
You may take Tylenol 500mg  every 6 hours and alternate with ibuprofen 400mg  to prevent fever, body aches and general inflammation.   Influenza, Adult Influenza ("the flu") is an infection in the lungs, nose, and throat (respiratory tract). It is caused by a virus. The flu causes many common cold symptoms, as well as a high fever and body aches. It can make you feel very sick. The flu spreads easily from person to person (is contagious). Getting a flu shot (influenza vaccination) every year is the best way to prevent the flu. Follow these instructions at home:  Take over-the-counter and prescription medicines only as told by your doctor.  Use a cool mist humidifier to add moisture (humidity) to the air in your home. This can make it easier to breathe.  Rest as needed.  Drink enough fluid to keep your pee (urine) clear or pale yellow.  Cover your mouth and nose when you cough or sneeze.  Wash your hands with soap and water often, especially after you cough or sneeze. If you cannot use soap and water, use hand sanitizer.  Stay home from work or school as told by your doctor. Unless you are visiting your doctor, try to avoid leaving home until your fever has been gone for 24 hours without the use of medicine.  Keep all follow-up visits as told by your doctor. This is important. How is this prevented?  Getting a yearly (annual) flu shot is the best way to avoid getting the flu. You may get the flu shot in late summer, fall, or winter. Ask your doctor when you should get your flu shot.  Wash your hands often or use hand sanitizer often.  Avoid contact with people who are sick during cold and flu season.  Eat healthy foods.  Drink plenty of fluids.  Get enough sleep.  Exercise regularly. Contact a doctor if:  You get new symptoms.  You have:  Chest pain.  Watery poop (diarrhea).  A fever.  Your cough gets worse.  You start to have more mucus.  You feel sick to your  stomach (nauseous).  You throw up (vomit). Get help right away if:  You start to be short of breath or have trouble breathing.  Your skin or nails turn a bluish color.  You have very bad pain or stiffness in your neck.  You get a sudden headache.  You get sudden pain in your face or ear.  You cannot stop throwing up. This information is not intended to replace advice given to you by your health care provider. Make sure you discuss any questions you have with your health care provider. Document Released: 11/30/2007 Document Revised: 07/29/2015 Document Reviewed: 12/15/2014 Elsevier Interactive Patient Education  2017 [employer]     IF you received an x-ray today, you will receive an invoice from [facility]. Please contact [facility] at [phone] with questions or concerns regarding your invoice.   IF you received labwork today, you will receive an invoice from [facility]. Please contact [facility] at [phone] with questions or concerns regarding your invoice.   Our billing staff will not be able to assist you with questions regarding bills from these companies.  You will be contacted with the lab results as soon as they are available. The fastest way to get your results is to activate your My Chart account. Instructions are located on the last page of this paperwork. If you have not heard from us regarding the results in 2 weeks, please  contact this office.

## 2016-03-20 ENCOUNTER — Telehealth: Payer: Self-pay

## 2016-03-20 NOTE — Telephone Encounter (Signed)
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

## 2016-03-21 NOTE — Telephone Encounter (Signed)
Form in [place] box

## 2016-03-22 NOTE — Telephone Encounter (Signed)
Thank you for your hard work Clydie BraunKaren!

## 2016-03-28 ENCOUNTER — Other Ambulatory Visit: Payer: Self-pay | Admitting: Urgent Care

## 2016-03-29 NOTE — Telephone Encounter (Signed)
Pa faxed 03/28/16

## 2016-05-16 ENCOUNTER — Other Ambulatory Visit: Payer: Self-pay | Admitting: Urgent Care

## 2016-05-16 NOTE — Telephone Encounter (Signed)
Spoke with patient. Advised of refill authorization and need for follow-up. Transferred to [place] to schedule.  Meds ordered this encounter  Medications  . Insulin Detemir (LEVEMIR FLEXTOUCH) 100 UNIT/ML Pen    Sig: Inject 60 Units into the skin daily at 10 pm.    Dispense:  54 mL    Refill:  0

## 2016-05-18 ENCOUNTER — Ambulatory Visit (INDEPENDENT_AMBULATORY_CARE_PROVIDER_SITE_OTHER): Payer: Commercial Managed Care - PPO | Admitting: Urgent Care

## 2016-05-18 ENCOUNTER — Encounter: Payer: Self-pay | Admitting: Urgent Care

## 2016-05-18 VITALS — BP 131/80 | HR 82 | Temp 98.6°F | Resp 16 | Ht 67.0 in | Wt 185.0 lb

## 2016-05-18 DIAGNOSIS — R0789 Other chest pain: Secondary | ICD-10-CM | POA: Diagnosis not present

## 2016-05-18 DIAGNOSIS — IMO0001 Reserved for inherently not codable concepts without codable children: Secondary | ICD-10-CM

## 2016-05-18 DIAGNOSIS — E1165 Type 2 diabetes mellitus with hyperglycemia: Secondary | ICD-10-CM | POA: Diagnosis not present

## 2016-05-18 DIAGNOSIS — R071 Chest pain on breathing: Secondary | ICD-10-CM | POA: Diagnosis not present

## 2016-05-18 DIAGNOSIS — Z794 Long term (current) use of insulin: Secondary | ICD-10-CM | POA: Diagnosis not present

## 2016-05-18 DIAGNOSIS — M79604 Pain in right leg: Secondary | ICD-10-CM

## 2016-05-18 MED ORDER — DAPAGLIFLOZIN PRO-METFORMIN ER 5-1000 MG PO TB24: 1.0000 | ORAL_TABLET | Freq: Two times a day (BID) | ORAL | 1 refills | Status: DC

## 2016-05-18 MED ORDER — MELOXICAM 7.5 MG PO TABS: 7.5000 mg | ORAL_TABLET | Freq: Every day | ORAL | 0 refills | Status: DC

## 2016-05-18 NOTE — Patient Instructions (Addendum)
Dapagliflozin; Metformin extended-release tablets What is this medicine? DAPAGLIFLOZIN; METFORMIN (DAP a gli FLOE zin; met FOR min) is a combination of 2 medicines used to treat type 2 diabetes. This medicine lowers blood sugar. Treatment is combined with a balanced diet and exercise. This medicine may be used for other purposes; ask your health care provider or pharmacist if you have questions. COMMON BRAND NAME(S): Xigduo XR What should I tell my health care provider before I take this medicine? They need to know if you have any of these conditions: -anemia -dehydration -diabetic ketoacidosis -diet low in salt -eating less due to illness, surgery, dieting, or any other reason -having surgery -heart disease -high cholesterol -history of pancreatitis or pancreas problems -history of yeast infection of the penis or vagina -if you often drink alcohol -infections in the bladder, kidneys, or urinary tract -kidney disease -liver disease -low blood pressure -polycystic ovary syndrome -problems urinating -serious infection or injury -type 1 diabetes -uncircumcised male -vomiting -an unusual or allergic reaction to dapagliflozin, metformin, other medicines, foods, dyes, or preservatives -pregnant or trying to get pregnant -breast-feeding How should I use this medicine? Take this medicine by mouth with a glass of water. Take this medicine in the morning with food. Follow the directions on the prescription label. Do not cut, crush, or chew this medicine. Take your doses at regular intervals. Do not take your medicine more often than directed. Do not stop taking except on your doctor's advice. A special MedGuide will be given to you by the pharmacist with each prescription and refill. Be sure to read this information carefully each time. Talk to your pediatrician regarding the use of this medicine in children. Special care may be needed. Overdosage: If you think you have taken too much of  this medicine contact a poison control center or emergency room at once. NOTE: This medicine is only for you. Do not share this medicine with others. What if I miss a dose? If you miss a dose, take it as soon as you can. If it is almost time for your next dose, take only that dose. Do not take double or extra doses. What may interact with this medicine? Do not take this medicine with any of the following medications: -dofetilide -gatifloxacin -certain contrast medicines given before X-rays, CT scans, MRI, or other procedures This medicine may also interact with the following medications: -acetazolamide -alcohol -certain antiviral medicines for HIV infection or hepatitis -cimetidine -crizotinib -digoxin -diuretics -male hormones, like estrogens or progestins and birth control pills -glycopyrrolate -isoniazid -lamotrigine -medicines for blood pressure, heart disease, irregular heart beat -memantine -methazolamide -midodrine -morphine -nicotinic acid -phenobarbital -phenothiazines like chlorpromazine, mesoridazine, prochlorperazine, thioridazine -phenytoin -procainamide -propantheline -quinidine -quinine -ranitidine -ranolazine -rifampin -ritonavir -steroid medicines like prednisone or cortisone -stimulant medicines for attention disorders, weight loss, or to stay awake -thyroid medicines -topiramate -trimethoprim -trospium -vancomycin -vandetanib -zonisamide This list may not describe all possible interactions. Give your health care provider a list of all the medicines, herbs, non-prescription drugs, or dietary supplements you use. Also tell them if you smoke, drink alcohol, or use illegal drugs. Some items may interact with your medicine. What should I watch for while using this medicine? Visit your doctor or health care professional for regular checks on your progress. This medicine can cause a serious condition in which there is too much acid in the blood. If you  develop nausea, vomiting, stomach pain, unusual tiredness, or breathing problems, stop taking this medicine and call your doctor right away. If  possible, use a ketone dipstick to check for ketones in your urine. A test called the HbA1C (A1C) will be monitored. This is a simple blood test. It measures your blood sugar control over the last 2 to 3 months. You will receive this test every 3 to 6 months. Learn how to check your blood sugar. Learn the symptoms of low and high blood sugar and how to manage them. Always carry a quick-source of sugar with you in case you have symptoms of low blood sugar. Examples include hard sugar candy or glucose tablets. Make sure others know that you can choke if you eat or drink when you develop serious symptoms of low blood sugar, such as seizures or unconsciousness. They must get medical help at once. Tell your doctor or health care professional if you have high blood sugar. You might need to change the dose of your medicine. If you are sick or exercising more than usual, you might need to change the dose of your medicine. Do not skip meals. Ask your doctor or health care professional if you should avoid alcohol. Many nonprescription cough and cold products contain sugar or alcohol. These can affect blood sugar. This medicine may cause ovulation in premenopausal women who do not have regular monthly periods. This may increase your chances of becoming pregnant. You should not take this medicine if you become pregnant or think you may be pregnant. Talk with your doctor or health care professional about your birth control options while taking this medicine. Contact your doctor or health care professional right away if think you are pregnant. If you are going to need surgery, a MRI, CT scan, or other procedure, tell your doctor that you are taking this medicine. You may need to stop taking this medicine before the procedure. Wear a medical ID bracelet or chain, and carry a card  that describes your disease and details of your medicine and dosage times. You may see empty tablets in your stool. This is normal. What side effects may I notice from receiving this medicine? Side effects that you should report to your doctor or health care professional as soon as possible: -allergic reactions like skin rash, itching or hives, swelling of the face, lips, or tongue -breathing problems -dizziness -feeling faint or lightheaded, falls -muscle aches or pains -muscle weakness -nausea, vomiting, unusual stomach upset or pain -signs and symptoms of low blood sugar such as feeling anxious, confusion, dizziness, increased hunger, unusually weak or tired, sweating, shakiness, cold, irritable, headache, blurred vision, fast heartbeat, loss of consciousness -signs and symptoms of a urinary tract infection, such as fever, chills, a burning feeling when urinating, blood in the urine, back pain -trouble passing urine or change in the amount of urine, including an urgent need to urinate more often, in larger amounts, or at night -penile discharge, itching, or pain in men -slow, irregular heartbeat -unusually tired or weak -vaginal discharge, itching, or odor in women Side effects that usually do not require medical attention (report to your doctor or health care professional if they continue or are bothersome): -constipation -diarrhea -headache -heartburn -mild increase in urination -stomach gas -stuffy or runny nose -sore throat -thirsty This list may not describe all possible side effects. Call your doctor for medical advice about side effects. You may report side effects to FDA at 1-800-FDA-1088. Where should I keep my medicine? Keep out of the reach of children. Store at room temperature between 15 and 30 degrees C (59 and 86 degrees F). Throw  away any unused medicine after the expiration date. NOTE: This sheet is a summary. It may not cover all possible information. If you have  questions about this medicine, talk to your doctor, pharmacist, or health care provider.  2018 Elsevier/Gold Standard (2015-03-25 11:03:47)    Costochondritis Costochondritis is swelling and irritation (inflammation) of the tissue (cartilage) that connects your ribs to your breastbone (sternum). This causes pain in the front of your chest. The pain usually starts gradually and involves more than one rib. What are the causes? The exact cause of this condition is not always known. It results from stress on the cartilage where your ribs attach to your sternum. The cause of this stress could be:  Chest injury (trauma).  Exercise or activity, such as lifting.  Severe coughing. What increases the risk? You may be at higher risk for this condition if you:  Are male.  Are 51?45 years old.  Recently started a new exercise or work activity.  Have low levels of vitamin D.  Have a condition that makes you cough frequently. What are the signs or symptoms? The main symptom of this condition is chest pain. The pain:  Usually starts gradually and can be sharp or dull.  Gets worse with deep breathing, coughing, or exercise.  Gets better with rest.  May be worse when you press on the sternum-rib connection (tenderness). How is this diagnosed? This condition is diagnosed based on your symptoms, medical history, and a physical exam. Your health care provider will check for tenderness when pressing on your sternum. This is the most important finding. You may also have tests to rule out other causes of chest pain. These may include:  A chest X-ray to check for lung problems.  An electrocardiogram (ECG) to see if you have a heart problem that could be causing the pain.  An imaging scan to rule out a chest or rib fracture. How is this treated? This condition usually goes away on its own over time. Your health care provider may prescribe an NSAID to reduce pain and inflammation. Your health  care provider may also suggest that you:  Rest and avoid activities that make pain worse.  Apply heat or cold to the area to reduce pain and inflammation.  Do exercises to stretch your chest muscles. If these treatments do not help, your health care provider may inject a numbing medicine at the sternum-rib connection to help relieve the pain. Follow these instructions at home:  Avoid activities that make pain worse. This includes any activities that use chest, abdominal, and side muscles.  If directed, put ice on the painful area:  Put ice in a plastic bag.  Place a towel between your skin and the bag.  Leave the ice on for 20 minutes, 2-3 times a day.  If directed, apply heat to the affected area as often as told by your health care provider. Use the heat source that your health care provider recommends, such as a moist heat pack or a heating pad.  Place a towel between your skin and the heat source.  Leave the heat on for 20-30 minutes.  Remove the heat if your skin turns bright red. This is especially important if you are unable to feel pain, heat, or cold. You may have a greater risk of getting burned.  Take over-the-counter and prescription medicines only as told by your health care provider.  Return to your normal activities as told by your health care provider. Ask your health  care provider what activities are safe for you.  Keep all follow-up visits as told by your health care provider. This is important. Contact a health care provider if:  You have chills or a fever.  Your pain does not go away or it gets worse.  You have a cough that does not go away (is persistent). Get help right away if:  You have shortness of breath. This information is not intended to replace advice given to you by your health care provider. Make sure you discuss any questions you have with your health care provider. Document Released: 11/30/2004 Document Revised: 09/10/2015 Document  Reviewed: 06/16/2015 Elsevier Interactive Patient Education  2017 [employer].     IF you received an x-ray today, you will receive an invoice from [facility]. Please contact [facility] at [phone] with questions or concerns regarding your invoice.   IF you received labwork today, you will receive an invoice from [place]. Please contact [facility] at [phone] with questions or concerns regarding your invoice.   Our billing staff will not be able to assist you with questions regarding bills from these companies.  You will be contacted with the lab results as soon as they are available. The fastest way to get your results is to activate your My Chart account. Instructions are located on the last page of this paperwork. If you have not heard from [place] regarding the results in 2 weeks, please contact this office.

## 2016-05-18 NOTE — Progress Notes (Signed)
    MRN: [id number] DOB: 09/08/1970  Subjective:   Jake Miller is a 46 y.o. male presenting for follow up on diabetes, chest pain, leg pain.  DM - Managed with 60 units of levemir, metformin, glipizide. His home blood sugar checks are 160-170's fasting. He was denied script for Lantus. He eats a lot of bread daily with his meals. He is due for a1c check today.  Chest pain - Reports 2 week history of chest wall pain over his lower mid chest. Pain was constant initially, has improved drastically in the last week. Has not tried medications for relief. Denies fever, diaphoresis, neck pain, jaw pain, n/v, abdominal pain. Pain has not been associated with food/eating. Denies smoking cigarettes. Has not had an ecg, does not have a cardiologist. Denies trauma. Works as an aviation mechanic.   Jake Miller has a current medication list which includes the following prescription(s): atorvastatin, blood glucose meter kit and supplies, cyclobenzaprine, glipizide, glucose blood, insulin detemir, insulin glargine, lisinopril, meloxicam, metformin, one touch lancets, benzonatate, and hydrocodone-homatropine. Also has No Known Allergies.  Jake Miller  has a past medical history of Diabetes mellitus; GERD (gastroesophageal reflux disease); and Hypertension. Also denies past surgical history.   Objective:   Vitals: BP 131/80   Pulse 82   Temp 98.6 F (37 C) (Oral)   Resp 16   Ht 5' 7" (1.702 m)   Wt 185 lb (83.9 kg)   SpO2 98%   BMI 28.98 kg/m   Physical Exam  Constitutional: He is oriented to person, place, and time. He appears well-developed and well-nourished.  HENT:  Mouth/Throat: Oropharynx is clear and moist.  Eyes: No scleral icterus.  Neck: Normal range of motion. Neck supple.  Cardiovascular: Normal rate, regular rhythm and intact distal pulses.  Exam reveals no gallop and no friction rub.   No murmur heard. Pulmonary/Chest: No respiratory distress. He has no wheezes. He has no rales. He exhibits  tenderness (reproducible over lower sternum, xiphoid process).  Abdominal: Soft. Bowel sounds are normal. He exhibits no distension and no mass. There is no tenderness. There is no guarding.  Musculoskeletal: He exhibits no edema.  Lymphadenopathy:    He has no cervical adenopathy.  Neurological: He is alert and oriented to person, place, and time.  Skin: Skin is warm and dry.   Assessment and Plan :   1. Uncontrolled type 2 diabetes mellitus without complication, with long-term current use of insulin (HCC) - A1c pending, will change dosing of levemir with lab results. Stop glipizide. Start Xigduo. Recommended dietary modifications. Recheck in 3 months. - Hemoglobin A1c - Ambulatory referral to diabetic education - Dapagliflozin-Metformin HCl ER (XIGDUO XR) 07-998 MG TB24; Take 1 tablet by mouth 2 (two) times daily with a meal.  Dispense: 180 tablet; Refill: 1  2. Chest wall pain 3. Costochondral chest pain - Physical exam findings reassuring. Will start meloxicam for musculoskeletal type pain. Will recheck in 1 week if no improvement. Consider ecg, chest x-ray at that point. Patient is in agreement.   4. Pain of right lower extremity - Recommended that we follow up in 1 week to discuss possibility of neuropathy and starting medical therapy for this. We did not have time today to address this with a 20 minutes appointment. Patient understood and will try to come in on a Saturday.   , PA-C [facility] [phone] 05/18/2016 12:19 PM  

## 2016-05-19 LAB — HEMOGLOBIN A1C
Est. average glucose Bld gHb Est-mCnc: 246 mg/dL
Hgb A1c MFr Bld: 10.2 % — ABNORMAL HIGH (ref 4.8–5.6)

## 2016-06-14 ENCOUNTER — Other Ambulatory Visit: Payer: Self-pay | Admitting: Urgent Care

## 2016-06-14 DIAGNOSIS — R0789 Other chest pain: Secondary | ICD-10-CM

## 2016-06-14 NOTE — Telephone Encounter (Signed)
05/18/16 last refill

## 2016-06-22 ENCOUNTER — Encounter: Payer: Self-pay | Admitting: Registered"

## 2016-06-22 ENCOUNTER — Encounter: Payer: Commercial Managed Care - PPO | Attending: Urgent Care | Admitting: Registered"

## 2016-06-22 DIAGNOSIS — Z713 Dietary counseling and surveillance: Secondary | ICD-10-CM | POA: Insufficient documentation

## 2016-06-22 DIAGNOSIS — E1165 Type 2 diabetes mellitus with hyperglycemia: Secondary | ICD-10-CM

## 2016-06-22 DIAGNOSIS — IMO0001 Reserved for inherently not codable concepts without codable children: Secondary | ICD-10-CM

## 2016-06-22 DIAGNOSIS — E119 Type 2 diabetes mellitus without complications: Secondary | ICD-10-CM | POA: Diagnosis not present

## 2016-06-22 DIAGNOSIS — Z794 Long term (current) use of insulin: Secondary | ICD-10-CM

## 2016-06-22 NOTE — Patient Instructions (Addendum)
Plan:  Call your doctor about plans for Ramadan and insulin Continue eating beans regularly Aim for 3-4 Carb Choices per meal (45-60 grams) +/- 1 either way  Aim for 0-2 Carbs per snack if hungry, protein bars may be an option Include protein in moderation with your meals and snacks Consider reading food labels for Total Carbohydrate of foods Consider increasing your activity to include some each day as tolerated Consider checking BG at alternate times per day as directed by MD  Contine taking medication as directed by MD

## 2016-06-22 NOTE — Progress Notes (Signed)
Diabetes Self-Management Education  Visit Type: First/Initial  Appt. Start Time: 1100 Appt. End Time: 1220  06/22/2016  [name], identified by name and date of birth, is a 46 y.o. male with a diagnosis of Diabetes: Type 2 (Pt thought because he uses insulin he was type 1).   ASSESSMENT Pt works as a Artist. Pt states he can have snacks while working, but need to be contained.  Pt states he will be fasting during Ramadan. Pt will return for follow-up visit to discuss dietary strategies during Ramadan  Height  (1.753 m), weight 181 lb 12.8 oz (82.5 kg). Body mass index is 26.85 kg/m.      Diabetes Self-Management Education - 06/22/16 1121      Visit Information   Visit Type First/Initial     Initial Visit   Diabetes Type Type 2  Pt thought because he uses insulin he was type 1   Are you currently following a meal plan? No   Are you taking your medications as prescribed? Yes  xigduo, levimir   Date Diagnosed 2005     Health Coping   How would you rate your overall health? Good     Psychosocial Assessment   Patient Belief/Attitude about Diabetes Motivated to manage diabetes   Self-care barriers English as a second language  pt states from [place]   What is the last grade level you completed in school? college     Complications   Last HgB A1C per patient/outside source 10.4 %  05/2016 per chart   How often do you check your blood sugar? 1-2 times/day  fasting   Fasting Blood glucose range (mg/dL) 16-109  60-454   Number of hypoglycemic episodes per month 1  after playing soccer   Can you tell when your blood sugar is low? Yes   What do you do if your blood sugar is low? eat something sweet, rest   Have you had a dilated eye exam in the past 12 months? Yes   Have you had a dental exam in the past 12 months? No   Are you checking your feet? Yes   How many days per week are you checking your feet? 7     Dietary Intake   Breakfast juice, tea  with milk, 2 piece cookies   Lunch pita bread, chicken salad, rice   Snack (afternoon) chips, water or juice   Dinner vegetable with soup, bread, grilled chicken or fish   Snack (evening) fava beans with veggies & cheese   Beverage(s) diet soda, fresh juice, water     Exercise   Exercise Type Moderate (swimming / aerobic walking)  soccer   How many days per week to you exercise? 2   How many minutes per day do you exercise? 1   Total minutes per week of exercise 2     Patient Education   Previous Diabetes Education Yes (please comment)  2005 first diagnosis   Disease state  Definition of diabetes, type 1 and 2, and the diagnosis of diabetes   Nutrition management  Role of diet in the treatment of diabetes and the relationship between the three main macronutrients and blood glucose level;Food label reading, portion sizes and measuring food.;Carbohydrate counting   Physical activity and exercise  Role of exercise on diabetes management, blood pressure control and cardiac health.;Identified with patient nutritional and/or medication changes necessary with exercise.   Monitoring Identified appropriate SMBG and/or A1C goals.     Individualized Goals (  developed by patient)   Nutrition Follow meal plan discussed   Physical Activity Exercise 5-7 days per week     Outcomes   Expected Outcomes Demonstrated interest in learning. Expect positive outcomes   Future DMSE PRN      Individualized Plan for Diabetes Self-Management Training:   Learning Objective:  Patient will have a greater understanding of diabetes self-management. Patient education plan is to attend individual and/or group sessions per assessed needs and concerns.   Plan:   Patient Instructions  Plan:  Call your doctor about plans for Ramadan and insulin Continue eating beans regularly Aim for 3-4 Carb Choices per meal (45-60 grams) +/- 1 either way  Aim for 0-2 Carbs per snack if hungry, protein bars may be an  option Include protein in moderation with your meals and snacks Consider reading food labels for Total Carbohydrate of foods Consider increasing your activity to include some each day as tolerated Consider checking BG at alternate times per day as directed by MD  Contine taking medication as directed by MD   Expected Outcomes:  Demonstrated interest in learning. Expect positive outcomes  Education material provided: Living Well with Diabetes, A1C conversion sheet, My Plate, Snack sheet and Carbohydrate counting sheet  If problems or questions, patient to contact team via:  Phone and Email  Future DSME appointment: PRN

## 2016-07-17 ENCOUNTER — Encounter: Payer: Commercial Managed Care - PPO | Attending: Urgent Care | Admitting: Registered"

## 2016-07-17 DIAGNOSIS — Z713 Dietary counseling and surveillance: Secondary | ICD-10-CM | POA: Diagnosis present

## 2016-07-17 DIAGNOSIS — Z794 Long term (current) use of insulin: Secondary | ICD-10-CM

## 2016-07-17 DIAGNOSIS — E1165 Type 2 diabetes mellitus with hyperglycemia: Secondary | ICD-10-CM

## 2016-07-17 DIAGNOSIS — E119 Type 2 diabetes mellitus without complications: Secondary | ICD-10-CM | POA: Insufficient documentation

## 2016-07-17 DIAGNOSIS — IMO0001 Reserved for inherently not codable concepts without codable children: Secondary | ICD-10-CM

## 2016-07-17 NOTE — Progress Notes (Signed)
Medical Nutrition Therapy:  Appt start time: 1100 end time:  1130.  Assessment:  Primary concerns today: Pt returns for a follow-up appointment for nutrition education for diabetes care during Ramadan fasting. Pt states he plans to fast between the hours of 4 am - 8:40 pm and after he breaks his fast in the evening he will take the insulin ~9 pm.   Pt reports that he will not be playing soccer during Ramadan. Pt states that if he has low blood sugar during fast he will break his fast. Pt states he would like to start seeing a physician who is a diabetes specialist. RD gave patient the phone number for [facility]. Pt states this office is also a more convenient location to his home.  Preferred Learning Style: No preference indicated   Learning Readiness:   Ready  MEDICATIONS: Pt states he is taking metformin with lunch, insulin at night, and has had low BG, 70-80, frequently in the mornings   Estimated energy needs: 2000 calories  Nutritional Diagnosis:  NB-1.1 Food and nutrition-related knowledge deficit As related to nutrition management during Ramadan fasting.  As evidenced by pt reported lack of knowledge.    Intervention:  Nutrition Education. Importance of watching BG levels during long periods of fasting. Effect of exercise on BG. Discussed medication action.  Patient Instructions: website with some good information about Ramadan and diabetes: [email or web address]/info/Ramadan-[email or web address]-Diabetes.[email or web address]   Avoid getting too much activity during fasting hours  Be sure to get protein with your meals and not too much refined carbohydrates.  Check in with your doctor before taking 2 doses of the metformin medication.  Aim to drink plenty of non-sugary beverages during non-fasting hours  Teaching Method Utilized: Visual Auditory  Handouts given during visit include:  Pages from International Diabetes Federation - Diabetes and Ramadan: Practical Guidelines  Ramadan my  plate guidelines  Barriers to learning/adherence to lifestyle change: none  Demonstrated degree of understanding via:  Teach Back   Monitoring/Evaluation:  Dietary intake, exercise, A1c, and body weight prn.

## 2016-07-17 NOTE — Patient Instructions (Addendum)
website with some good information about Ramadan and diabetes: [email or web address]/info/Ramadan-[email or web address]-Diabetes.[email or web address]   Avoid getting too much activity during fasting hours  Be sure to get protein with your meals and not too much refined carbohydrates.  Check in with your doctor before taking 2 doses of the metformin medication.

## 2016-07-18 ENCOUNTER — Telehealth: Payer: Self-pay | Admitting: Family Medicine

## 2016-07-18 NOTE — Telephone Encounter (Signed)
LMOM TO CALL AND RESCHEDULE HIS APPOINTMENT WITH MANI ON 08-24-16 FOR A 3 MONTH F/U DM CHECK

## 2016-08-13 ENCOUNTER — Other Ambulatory Visit: Payer: Self-pay | Admitting: Physician Assistant

## 2016-08-24 ENCOUNTER — Ambulatory Visit: Payer: Commercial Managed Care - PPO | Admitting: Urgent Care

## 2016-08-26 ENCOUNTER — Other Ambulatory Visit: Payer: Self-pay | Admitting: Urgent Care

## 2016-08-26 DIAGNOSIS — Z794 Long term (current) use of insulin: Principal | ICD-10-CM

## 2016-08-26 DIAGNOSIS — IMO0001 Reserved for inherently not codable concepts without codable children: Secondary | ICD-10-CM

## 2016-08-26 DIAGNOSIS — E1165 Type 2 diabetes mellitus with hyperglycemia: Principal | ICD-10-CM

## 2016-08-31 NOTE — Telephone Encounter (Signed)
Patient has appt with me on 09/02/2016 for diabetes recheck. Given that he is on Levemir, he should not be on glipizide. Also, it is unclear to me if he is actually taking Xigduo or just Metformin. I will review medications with patient on 09/02/2016.

## 2016-09-01 ENCOUNTER — Other Ambulatory Visit: Payer: Self-pay | Admitting: Urgent Care

## 2016-09-01 DIAGNOSIS — Z794 Long term (current) use of insulin: Principal | ICD-10-CM

## 2016-09-01 DIAGNOSIS — E119 Type 2 diabetes mellitus without complications: Secondary | ICD-10-CM

## 2016-09-02 ENCOUNTER — Ambulatory Visit (INDEPENDENT_AMBULATORY_CARE_PROVIDER_SITE_OTHER): Payer: Commercial Managed Care - PPO | Admitting: Urgent Care

## 2016-09-02 ENCOUNTER — Encounter: Payer: Self-pay | Admitting: Urgent Care

## 2016-09-02 VITALS — BP 129/83 | HR 79 | Temp 98.5°F | Resp 16 | Ht 67.5 in | Wt <= 1120 oz

## 2016-09-02 DIAGNOSIS — E782 Mixed hyperlipidemia: Secondary | ICD-10-CM

## 2016-09-02 DIAGNOSIS — IMO0001 Reserved for inherently not codable concepts without codable children: Secondary | ICD-10-CM

## 2016-09-02 DIAGNOSIS — Z794 Long term (current) use of insulin: Secondary | ICD-10-CM

## 2016-09-02 DIAGNOSIS — E1165 Type 2 diabetes mellitus with hyperglycemia: Secondary | ICD-10-CM | POA: Diagnosis not present

## 2016-09-02 DIAGNOSIS — I10 Essential (primary) hypertension: Secondary | ICD-10-CM | POA: Diagnosis not present

## 2016-09-02 LAB — POCT GLYCOSYLATED HEMOGLOBIN (HGB A1C): Hemoglobin A1C: 8.9

## 2016-09-02 MED ORDER — DAPAGLIFLOZIN PRO-METFORMIN ER 5-1000 MG PO TB24: 1.0000 | ORAL_TABLET | Freq: Two times a day (BID) | ORAL | 1 refills | Status: DC

## 2016-09-02 MED ORDER — INSULIN GLARGINE 100 UNITS/ML SOLOSTAR PEN
40.0000 [IU] | PEN_INJECTOR | Freq: Every day | SUBCUTANEOUS | 11 refills | Status: DC
Start: 2016-09-02 — End: 2017-10-13

## 2016-09-02 MED ORDER — ATORVASTATIN CALCIUM 20 MG PO TABS: 20.0000 mg | ORAL_TABLET | Freq: Every day | ORAL | 1 refills | Status: DC

## 2016-09-02 MED ORDER — LISINOPRIL 10 MG PO TABS: 10.0000 mg | ORAL_TABLET | Freq: Every day | ORAL | 1 refills | Status: DC

## 2016-09-02 NOTE — Patient Instructions (Addendum)
Diabetes Mellitus and Food It is important for you to manage your blood sugar (glucose) level. Your blood glucose level can be greatly affected by what you eat. Eating healthier foods in the appropriate amounts throughout the day at about the same time each day will help you control your blood glucose level. It can also help slow or prevent worsening of your diabetes mellitus. Healthy eating may even help you improve the level of your blood pressure and reach or maintain a healthy weight. General recommendations for healthful eating and cooking habits include:  Eating meals and snacks regularly. Avoid going long periods of time without eating to lose weight.  Eating a diet that consists mainly of plant-based foods, such as fruits, vegetables, nuts, legumes, and whole grains.  Using low-heat cooking methods, such as baking, instead of high-heat cooking methods, such as deep frying.  Work with your dietitian to make sure you understand how to use the Nutrition Facts information on food labels. How can food affect me? Carbohydrates Carbohydrates affect your blood glucose level more than any other type of food. Your dietitian will help you determine how many carbohydrates to eat at each meal and teach you how to count carbohydrates. Counting carbohydrates is important to keep your blood glucose at a healthy level, especially if you are using insulin or taking certain medicines for diabetes mellitus. Alcohol Alcohol can cause sudden decreases in blood glucose (hypoglycemia), especially if you use insulin or take certain medicines for diabetes mellitus. Hypoglycemia can be a life-threatening condition. Symptoms of hypoglycemia (sleepiness, dizziness, and disorientation) are similar to symptoms of having too much alcohol. If your health care provider has given you approval to drink alcohol, do so in moderation and use the following guidelines:  Women should not have more than one drink per day, and men  should not have more than two drinks per day. One drink is equal to: ? 12 oz of beer. ? 5 oz of wine. ? 1 oz of hard liquor.  Do not drink on an empty stomach.  Keep yourself hydrated. Have water, diet soda, or unsweetened iced tea.  Regular soda, juice, and other mixers might contain a lot of carbohydrates and should be counted.  What foods are not recommended? As you make food choices, it is important to remember that all foods are not the same. Some foods have fewer nutrients per serving than other foods, even though they might have the same number of calories or carbohydrates. It is difficult to get your body what it needs when you eat foods with fewer nutrients. Examples of foods that you should avoid that are high in calories and carbohydrates but low in nutrients include:  Trans fats (most processed foods list trans fats on the Nutrition Facts label).  Regular soda.  Juice.  Candy.  Sweets, such as cake, pie, doughnuts, and cookies.  Fried foods.  What foods can I eat? Eat nutrient-rich foods, which will nourish your body and keep you healthy. The food you should eat also will depend on several factors, including:  The calories you need.  The medicines you take.  Your weight.  Your blood glucose level.  Your blood pressure level.  Your cholesterol level.  You should eat a variety of foods, including:  Protein. ? Lean cuts of meat. ? Proteins low in saturated fats, such as fish, egg whites, and beans. Avoid processed meats.  Fruits and vegetables. ? Fruits and vegetables that may help control blood glucose levels, such as apples,   mangoes, and yams.  Dairy products. ? Choose fat-free or low-fat dairy products, such as milk, yogurt, and cheese.  Grains, bread, pasta, and rice. ? Choose whole grain products, such as multigrain bread, whole oats, and brown rice. These foods may help control blood pressure.  Fats. ? Foods containing healthful fats, such as  nuts, avocado, olive oil, canola oil, and fish.  Does everyone with diabetes mellitus have the same meal plan? Because every person with diabetes mellitus is different, there is not one meal plan that works for everyone. It is very important that you meet with a dietitian who will help you create a meal plan that is just right for you. This information is not intended to replace advice given to you by your health care provider. Make sure you discuss any questions you have with your health care provider. Document Released: 11/17/2004 Document Revised: 07/29/2015 Document Reviewed: 01/17/2013 Elsevier Interactive Patient Education  2017 Elsevier Inc.    IF you received an x-ray today, you will receive an invoice from [facility]. Please contact [facility] at [phone] with questions or concerns regarding your invoice.   IF you received labwork today, you will receive an invoice from [facility]. Please contact [facility] at [phone] with questions or concerns regarding your invoice.   Our billing staff will not be able to assist you with questions regarding bills from these companies.  You will be contacted with the lab results as soon as they are available. The fastest way to get your results is to activate your My Chart account. Instructions are located on the last page of this paperwork. If you have not heard from us regarding the results in 2 weeks, please contact this office.      

## 2016-09-02 NOTE — Progress Notes (Signed)
MRN: [id number]  Subjective:   Jake Miller is a 46 y.o. male who presents for follow up of Type 2 Diabetes Mellitus.   Patient is currently managed with Lantus 50 units nightly. We have consistently had trouble with his insurance provider covering Lantus. I last prescribed him Levemir but he obtained Lantus through his own coupons. Patient is checking home blood sugars. Home blood sugar is generally 80's-120's fasting. Patient denies blurred vision, polydipsia, chest pain, nausea, vomiting, abdominal pain, hematuria, polyuria, skin infections, numbness or tingling. Patient is checking their feet daily. He reports that his right great toe nail is growing very slowly. Denies fever, pain, redness, drainage of pus or bleeding, itching, discoloration. Diet has been much better. Patient was fasting for Ramadan. Patient is exercising.  Jake Miller has a current medication list which includes the following prescription(s): atorvastatin, blood glucose meter kit and supplies, cyclobenzaprine, dapagliflozin-metformin hcl er, glucose blood, lisinopril, meloxicam, and one touch lancets. He has No Known Allergies.   Known diabetic complications: none  Immunizations: Patient declines.  Objective:   PHYSICAL EXAM BP 129/83   Pulse 79   Temp 98.5 F (36.9 C) (Oral)   Resp 16   Ht 5' 7.5" (1.715 m)   Wt 11 lb 4.6 oz (5.12 kg)   SpO2 98%   BMI 1.74 kg/m   Wt Readings from Last 3 Encounters:  09/02/16 11 lb 4.6 oz (5.12 kg)  06/22/16 181 lb 12.8 oz (82.5 kg)  05/18/16 185 lb (83.9 kg)   Physical Exam  Constitutional: He is oriented to person, place, and time. He appears well-developed and well-nourished.  HENT:  Mouth/Throat: Oropharynx is clear and moist.  Eyes: No scleral icterus.  Cardiovascular: Normal rate, regular rhythm and intact distal pulses.  Exam reveals no gallop and no friction rub.   No murmur heard. Pulmonary/Chest: No respiratory distress. He has no wheezes. He has no rales.   Abdominal: Soft. Bowel sounds are normal. He exhibits no distension and no mass. There is no tenderness. There is no guarding.  Musculoskeletal:       Feet:  Neurological: He is alert and oriented to person, place, and time.  Skin: Skin is warm and dry.  Psychiatric: He has a normal mood and affect.    Diabetic Foot Exam - Simple   Simple Foot Form Diabetic Foot exam was performed with the following findings:  Yes 09/02/2016 12:25 PM  Visual Inspection No deformities, no ulcerations, no other skin breakdown bilaterally:  Yes Sensation Testing Intact to touch and monofilament testing bilaterally:  Yes Pulse Check Posterior Tibialis and Dorsalis pulse intact bilaterally:  Yes Comments    Results for orders placed or performed in visit on 09/02/16 (from the past 24 hour(s))  POCT glycosylated hemoglobin (Hb A1C)     Status: None   Collection Time: 09/02/16 12:06 PM  Result Value Ref Range   Hemoglobin A1C 8.9    Assessment and Plan :   1. Uncontrolled type 2 diabetes mellitus without complication, with long-term current use of insulin (HCC) - Improved, continue dietary modifications. We will try generic refill of Lantus. Patient's pharmacy will call me if it does not get approved. Maintain Xigduo for now. Return-to-clinic precautions discussed, patient verbalized understanding. Otherwise, f/u in 3 months. - POCT glycosylated hemoglobin (Hb A1C) - Comprehensive metabolic panel - HM DIABETES FOOT EXAM - insulin glargine (LANTUS) 100 unit/mL SOPN; Inject 0.4 mLs (40 Units total) into the skin at bedtime.  Dispense: 15 mL; Refill: 11 -  Dapagliflozin-Metformin HCl ER (XIGDUO XR) 07-998 MG TB24; Take 1 tablet by mouth 2 (two) times daily with a meal.  Dispense: 180 tablet; Refill: 1  2. Mixed hyperlipidemia - Lipid panel pending. Refill provided.  3. Essential hypertension - Stable, refill provided. Labs pending. - lisinopril (PRINIVIL,ZESTRIL) 10 MG tablet; Take 1 tablet (10 mg  total) by mouth daily.  Dispense: 90 tablet; Refill: 1   Jaynee Eagles, PA-C [facility] [phone] 09/02/2016 11:59 AM

## 2016-09-03 ENCOUNTER — Encounter: Payer: Self-pay | Admitting: Urgent Care

## 2016-09-03 LAB — COMPREHENSIVE METABOLIC PANEL
A/G RATIO: 1.5 (ref 1.2–2.2)
ALT: 17 IU/L (ref 0–44)
AST: 19 IU/L (ref 0–40)
Albumin: 4.5 g/dL (ref 3.5–5.5)
Alkaline Phosphatase: 66 IU/L (ref 39–117)
BUN / CREAT RATIO: 15 (ref 9–20)
BUN: 16 mg/dL (ref 6–24)
Bilirubin Total: 0.6 mg/dL (ref 0.0–1.2)
CALCIUM: 10 mg/dL (ref 8.7–10.2)
CO2: 23 mmol/L (ref 20–29)
Chloride: 99 mmol/L (ref 96–106)
Creatinine, Ser: 1.09 mg/dL (ref 0.76–1.27)
GFR, EST AFRICAN AMERICAN: 94 mL/min/{1.73_m2} (ref >59)
GFR, EST NON AFRICAN AMERICAN: 82 mL/min/{1.73_m2} (ref >59)
Globulin, Total: 3.1 g/dL (ref 1.5–4.5)
Glucose: 219 mg/dL — ABNORMAL HIGH (ref 65–99)
POTASSIUM: 4.5 mmol/L (ref 3.5–5.2)
Sodium: 135 mmol/L (ref 134–144)
TOTAL PROTEIN: 7.6 g/dL (ref 6.0–8.5)

## 2016-09-03 LAB — LIPID PANEL
CHOLESTEROL TOTAL: 146 mg/dL (ref 100–199)
Chol/HDL Ratio: 4.7 ratio (ref 0.0–5.0)
HDL: 31 mg/dL — ABNORMAL LOW (ref >39)
LDL CALC: 77 mg/dL (ref 0–99)
TRIGLYCERIDES: 189 mg/dL — AB (ref 0–149)
VLDL CHOLESTEROL CAL: 38 mg/dL (ref 5–40)

## 2016-09-04 NOTE — Progress Notes (Signed)
Letter sent.

## 2016-09-19 ENCOUNTER — Telehealth: Payer: Self-pay

## 2016-09-19 NOTE — Telephone Encounter (Signed)
Morrie SheldonAshley- pharmacist called needing a new to change patient's Lantus Rx to [employer] because insurance doesn't cover Lantus and recommends the Basaglar that is similar to Lantus. I advised her it's okay to change to preferred brand with the same directions.

## 2016-09-21 NOTE — Telephone Encounter (Signed)
Thank you Rodney Boozeasha! Good work.

## 2017-02-25 ENCOUNTER — Other Ambulatory Visit: Payer: Self-pay | Admitting: Urgent Care

## 2017-02-25 DIAGNOSIS — I10 Essential (primary) hypertension: Secondary | ICD-10-CM

## 2017-04-19 ENCOUNTER — Ambulatory Visit: Payer: Commercial Managed Care - PPO | Admitting: Urgent Care

## 2017-04-19 ENCOUNTER — Encounter: Payer: Self-pay | Admitting: Urgent Care

## 2017-04-19 VITALS — BP 105/70 | HR 88 | Temp 98.5°F | Resp 16 | Ht 67.5 in | Wt 183.6 lb

## 2017-04-19 DIAGNOSIS — Z8719 Personal history of other diseases of the digestive system: Secondary | ICD-10-CM | POA: Diagnosis not present

## 2017-04-19 DIAGNOSIS — R1013 Epigastric pain: Secondary | ICD-10-CM | POA: Diagnosis not present

## 2017-04-19 DIAGNOSIS — R5383 Other fatigue: Secondary | ICD-10-CM | POA: Diagnosis not present

## 2017-04-19 DIAGNOSIS — R1084 Generalized abdominal pain: Secondary | ICD-10-CM

## 2017-04-19 DIAGNOSIS — Z794 Long term (current) use of insulin: Secondary | ICD-10-CM

## 2017-04-19 DIAGNOSIS — E119 Type 2 diabetes mellitus without complications: Secondary | ICD-10-CM

## 2017-04-19 MED ORDER — FAMOTIDINE 20 MG PO TABS: 20.0000 mg | ORAL_TABLET | Freq: Two times a day (BID) | ORAL | 0 refills | Status: DC

## 2017-04-19 NOTE — Patient Instructions (Addendum)
Increase your insulin dose to 45 units at bedtime. Hydrate well with at least 2 liters (1 gallon) of water daily.     Abdominal Pain, Adult Many things can cause belly (abdominal) pain. Most times, belly pain is not dangerous. Many cases of belly pain can be watched and treated at home. Sometimes belly pain is serious, though. Your doctor will try to find the cause of your belly pain. Follow these instructions at home:  Take over-the-counter and prescription medicines only as told by your doctor. Do not take medicines that help you poop (laxatives) unless told to by your doctor.  Drink enough fluid to keep your pee (urine) clear or pale yellow.  Watch your belly pain for any changes.  Keep all follow-up visits as told by your doctor. This is important. Contact a doctor if:  Your belly pain changes or gets worse.  You are not hungry, or you lose weight without trying.  You are having trouble pooping (constipated) or have watery poop (diarrhea) for more than 2-3 days.  You have pain when you pee or poop.  Your belly pain wakes you up at night.  Your pain gets worse with meals, after eating, or with certain foods.  You are throwing up and cannot keep anything down.  You have a fever. Get help right away if:  Your pain does not go away as soon as your doctor says it should.  You cannot stop throwing up.  Your pain is only in areas of your belly, such as the right side or the left lower part of the belly.  You have bloody or black poop, or poop that looks like tar.  You have very bad pain, cramping, or bloating in your belly.  You have signs of not having enough fluid or water in your body (dehydration), such as: ? Dark pee, very little pee, or no pee. ? Cracked lips. ? Dry mouth. ? Sunken eyes. ? Sleepiness. ? Weakness. This information is not intended to replace advice given to you by your health care provider. Make sure you discuss any questions you have with your  health care provider. Document Released: 08/09/2007 Document Revised: 09/10/2015 Document Reviewed: 08/04/2015 Elsevier Interactive Patient Education  2018 [employer]     IF you received an x-ray today, you will receive an invoice from [facility]. Please contact [facility] at [phone] with questions or concerns regarding your invoice.   IF you received labwork today, you will receive an invoice from [facility]. Please contact [facility] at [phone] with questions or concerns regarding your invoice.   Our billing staff will not be able to assist you with questions regarding bills from these companies.  You will be contacted with the lab results as soon as they are available. The fastest way to get your results is to activate your My Chart account. Instructions are located on the last page of this paperwork. If you have not heard from us regarding the results in 2 weeks, please contact this office.

## 2017-04-19 NOTE — Progress Notes (Signed)
   MRN: [id number] DOB: [birth year]  Subjective:   Jake Miller is a 47 y.o. male presenting for 2 day history of generalized abdominal pain, epigastric pain. Has also had decreased appetite, fatigue. Fasting blood sugar is 170-190 with 40 units of insulin at bedtime. Denies fever, burping, chest pain, n/v, diarrhea, constipation, bloody stools. He is hydrating with ~3 bottles of water daily. Denies smoking cigarettes.  Irl has a current medication list which includes the following prescription(s): atorvastatin, blood glucose meter kit and supplies, cyclobenzaprine, dapagliflozin-metformin hcl er, glucose blood, insulin glargine, lisinopril, and one touch lancets. Also has No Known Allergies.  Jake Miller  has a past medical history of Diabetes mellitus, GERD (gastroesophageal reflux disease), and Hypertension. Denies past surgical history.   Objective:   Vitals: BP 105/70   Pulse 88   Temp 98.5 F (36.9 C) (Oral)   Resp 16   Ht 5' 7.5" (1.715 m)   Wt 183 lb 9.6 oz (83.3 kg)   SpO2 100%   BMI 28.33 kg/m   Physical Exam  Constitutional: He is oriented to person, place, and time. He appears well-developed and well-nourished.  HENT:  Mouth/Throat: Oropharynx is clear and moist.  Eyes: No scleral icterus.  Neck: No thyromegaly present.  Cardiovascular: Normal rate, regular rhythm and intact distal pulses. Exam reveals no gallop and no friction rub.  No murmur heard. Pulmonary/Chest: No respiratory distress. He has no wheezes. He has no rales.  Abdominal: Soft. Bowel sounds are normal. He exhibits no distension and no mass. There is no tenderness. There is no guarding.  Neurological: He is alert and oriented to person, place, and time.  Skin: Skin is warm and dry.  Psychiatric: He has a normal mood and affect.   Assessment and Plan :   Generalized abdominal pain  Abdominal pain, epigastric - Plan: Comprehensive metabolic panel, H. pylori breath test, Hemoglobin A1c,  CBC  Diabetes mellitus type 2, insulin dependent (HCC)  Other fatigue - Plan: Thyroid Panel With TSH  History of gastroesophageal reflux (GERD)  Labs pending, will start Pepcid given history of GERD. Return-to-clinic precautions discussed, patient verbalized understanding.   Jaynee Eagles, PA-C [facility] [phone] 04/19/2017  5:11 PM

## 2017-04-20 LAB — COMPREHENSIVE METABOLIC PANEL
ALBUMIN: 4.4 g/dL (ref 3.5–5.5)
ALT: 34 IU/L (ref 0–44)
AST: 19 IU/L (ref 0–40)
Albumin/Globulin Ratio: 1.4 (ref 1.2–2.2)
Alkaline Phosphatase: 71 IU/L (ref 39–117)
BUN / CREAT RATIO: 15 (ref 9–20)
BUN: 15 mg/dL (ref 6–24)
Bilirubin Total: 0.5 mg/dL (ref 0.0–1.2)
CALCIUM: 9.3 mg/dL (ref 8.7–10.2)
CO2: 20 mmol/L (ref 20–29)
CREATININE: 1 mg/dL (ref 0.76–1.27)
Chloride: 101 mmol/L (ref 96–106)
GFR calc Af Amer: 104 mL/min/{1.73_m2} (ref >59)
GFR, EST NON AFRICAN AMERICAN: 90 mL/min/{1.73_m2} (ref >59)
GLOBULIN, TOTAL: 3.1 g/dL (ref 1.5–4.5)
Glucose: 294 mg/dL — ABNORMAL HIGH (ref 65–99)
Potassium: 4.7 mmol/L (ref 3.5–5.2)
SODIUM: 136 mmol/L (ref 134–144)
Total Protein: 7.5 g/dL (ref 6.0–8.5)

## 2017-04-20 LAB — CBC
Hematocrit: 43.9 % (ref 37.5–51.0)
Hemoglobin: 14.6 g/dL (ref 13.0–17.7)
MCH: 29.3 pg (ref 26.6–33.0)
MCHC: 33.3 g/dL (ref 31.5–35.7)
MCV: 88 fL (ref 79–97)
PLATELETS: 285 10*3/uL (ref 150–379)
RBC: 4.99 x10E6/uL (ref 4.14–5.80)
RDW: 13.1 % (ref 12.3–15.4)
WBC: 9.8 10*3/uL (ref 3.4–10.8)

## 2017-04-20 LAB — HEMOGLOBIN A1C
Est. average glucose Bld gHb Est-mCnc: 237 mg/dL
Hgb A1c MFr Bld: 9.9 % — ABNORMAL HIGH (ref 4.8–5.6)

## 2017-04-20 LAB — THYROID PANEL WITH TSH
Free Thyroxine Index: 2.1 (ref 1.2–4.9)
T3 Uptake Ratio: 28 % (ref 24–39)
T4, Total: 7.4 ug/dL (ref 4.5–12.0)
TSH: 1.92 u[IU]/mL (ref 0.450–4.500)

## 2017-04-22 LAB — H. PYLORI BREATH TEST: H pylori Breath Test: POSITIVE — AB

## 2017-04-24 ENCOUNTER — Encounter: Payer: Self-pay | Admitting: Urgent Care

## 2017-04-24 ENCOUNTER — Other Ambulatory Visit: Payer: Self-pay | Admitting: Urgent Care

## 2017-04-24 MED ORDER — CLARITHROMYCIN 500 MG PO TABS: 500.0000 mg | ORAL_TABLET | Freq: Two times a day (BID) | ORAL | 0 refills | Status: DC

## 2017-04-24 MED ORDER — OMEPRAZOLE 20 MG PO CPDR: 20.0000 mg | DELAYED_RELEASE_CAPSULE | Freq: Two times a day (BID) | ORAL | 1 refills | Status: DC

## 2017-04-24 MED ORDER — AMOXICILLIN 500 MG PO CAPS: 1000.0000 mg | ORAL_CAPSULE | Freq: Two times a day (BID) | ORAL | 0 refills | Status: DC

## 2017-04-24 NOTE — Progress Notes (Signed)
Letter sent out 

## 2017-04-25 ENCOUNTER — Other Ambulatory Visit: Payer: Self-pay | Admitting: Urgent Care

## 2017-04-25 DIAGNOSIS — E1165 Type 2 diabetes mellitus with hyperglycemia: Principal | ICD-10-CM

## 2017-04-25 DIAGNOSIS — Z794 Long term (current) use of insulin: Principal | ICD-10-CM

## 2017-04-25 DIAGNOSIS — IMO0001 Reserved for inherently not codable concepts without codable children: Secondary | ICD-10-CM

## 2017-05-18 ENCOUNTER — Other Ambulatory Visit: Payer: Self-pay | Admitting: Urgent Care

## 2017-05-18 NOTE — Telephone Encounter (Signed)
Famotidine refill Last OV: 04/19/17 Last Refill:04/19/17 #60 Pharmacy:[facility] [address]  PCP: Wallis BambergMario Mani PA

## 2017-05-24 ENCOUNTER — Other Ambulatory Visit: Payer: Self-pay | Admitting: Urgent Care

## 2017-05-24 DIAGNOSIS — E119 Type 2 diabetes mellitus without complications: Secondary | ICD-10-CM

## 2017-05-24 DIAGNOSIS — Z794 Long term (current) use of insulin: Principal | ICD-10-CM

## 2017-05-30 ENCOUNTER — Other Ambulatory Visit: Payer: Self-pay | Admitting: Urgent Care

## 2017-05-30 DIAGNOSIS — I10 Essential (primary) hypertension: Secondary | ICD-10-CM

## 2017-06-15 ENCOUNTER — Other Ambulatory Visit: Payer: Self-pay | Admitting: Urgent Care

## 2017-06-25 ENCOUNTER — Other Ambulatory Visit: Payer: Self-pay | Admitting: Urgent Care

## 2017-06-25 DIAGNOSIS — I10 Essential (primary) hypertension: Secondary | ICD-10-CM

## 2017-07-07 ENCOUNTER — Other Ambulatory Visit: Payer: Self-pay | Admitting: Urgent Care

## 2017-07-09 NOTE — Telephone Encounter (Signed)
Pepcid 20 mg refill request  LOV 04/19/17 with Wallis Bamberg  Not sure which GERD med they are taking.    Looks like started on Pepcid and then changed to Prilosec on 2/19 19 by Marquita Palms.  [facility] - [place], [place] - [address]

## 2017-07-14 ENCOUNTER — Other Ambulatory Visit: Payer: Self-pay | Admitting: Urgent Care

## 2017-07-14 NOTE — Telephone Encounter (Signed)
Please call patient and confirm which medication they actually are taking and are requesting. I am happy to refill either or both famotidine, omeprazole.

## 2017-07-16 NOTE — Telephone Encounter (Signed)
Patient is using Prilosec 20 MG as needed.  Also, requested Basaglar Insulin refill. [facility]-this is what they have given him in place of the expensive Lantus Insulin. Refilled Prilosec 20 MG tab. [facility]

## 2017-07-16 NOTE — Telephone Encounter (Signed)
TC. Left VM to phone with which he is using-prilosec or pepcid.

## 2017-07-21 ENCOUNTER — Other Ambulatory Visit: Payer: Self-pay | Admitting: Urgent Care

## 2017-07-21 DIAGNOSIS — I10 Essential (primary) hypertension: Secondary | ICD-10-CM

## 2017-07-26 ENCOUNTER — Ambulatory Visit: Payer: Self-pay | Admitting: *Deleted

## 2017-07-26 NOTE — Telephone Encounter (Signed)
Pt called stating that he is tired, having headaches, coughing hard; he also reports that his blood sugar yesterday was in the 200's (has been 120-140's before patient breaks his fast); his symptoms have been going on for 2 days; recommendations made per nurse triage to include seeing a physician within 24 hours; pt offered and accepted appointment with Wallis Bamberg, [facility] on 07/27/17 at 1000; he verbalizes understanding; the pt also expresses concern because he needs a note for being out of work today and tomorrow due to his illness; explained to pt that he can request this note when he goes to the appointment tomorrow but the office can not write a note until he is seen by the provider; also explained to pt that he has the option of being seen at urgent care or the ED today and perhaps they will give him a note explaining his absence today; conference call intitiated with Carollee Herter at [facility] and she verbalizes that she concurs with the instructions given to the pt;  the pt verbalizes under standing and states that he will wait to see Wallis Bamberg on 07/27/17 at 1000; will route to office for notification of this upcoming appointment. Reason for Disposition . [1] MODERATE weakness (i.e., interferes with work, school, normal activities) AND [2] persists > 3 days  Answer Assessment - Initial Assessment Questions 1. DESCRIPTION: "Describe how you are feeling."     No energy 2. SEVERITY: "How bad is it?"  "Can you stand and walk?"   - MILD - Feels weak or tired, but does not interfere with work, school or normal activities   - MODERATE - Able to stand and walk; weakness interferes with work, school, or normal activities   - SEVERE - Unable to stand or walk     moderate 3. ONSET:  "When did the weakness begin?"     07/24/17 4. CAUSE: "What do you think is causing the weakness?"     unsure 5. MEDICINES: "Have you recently started a new medicine or had a change in the amount of a medicine?"     no 6.  OTHER SYMPTOMS: "Do you have any other symptoms?" (e.g., chest pain, fever, cough, SOB, vomiting, diarrhea, bleeding)     Cough,chest pain with cough;  increased blood sugars 7. PREGNANCY: "Is there any chance you are pregnant?" "When was your last menstrual period?"     n/a  Protocols used: WEAKNESS (GENERALIZED) AND FATIGUE-A-AH

## 2017-07-27 ENCOUNTER — Ambulatory Visit: Payer: Self-pay | Admitting: Urgent Care

## 2017-08-13 ENCOUNTER — Other Ambulatory Visit: Payer: Self-pay | Admitting: Urgent Care

## 2017-08-13 NOTE — Telephone Encounter (Signed)
Patient called, left VM to return call to the office to schedule an appointment in order to continue to get medication refills.

## 2017-08-14 ENCOUNTER — Other Ambulatory Visit: Payer: Self-pay | Admitting: Urgent Care

## 2017-08-15 ENCOUNTER — Other Ambulatory Visit: Payer: Self-pay | Admitting: Urgent Care

## 2017-08-15 DIAGNOSIS — I10 Essential (primary) hypertension: Secondary | ICD-10-CM

## 2017-10-13 ENCOUNTER — Other Ambulatory Visit: Payer: Self-pay | Admitting: Urgent Care

## 2017-10-13 DIAGNOSIS — IMO0001 Reserved for inherently not codable concepts without codable children: Secondary | ICD-10-CM

## 2017-10-13 DIAGNOSIS — E1165 Type 2 diabetes mellitus with hyperglycemia: Principal | ICD-10-CM

## 2017-10-13 DIAGNOSIS — Z794 Long term (current) use of insulin: Principal | ICD-10-CM

## 2017-10-15 NOTE — Telephone Encounter (Signed)
Patient called, left VM to return call to the office to schedule a follow up appointment for diabetes.

## 2017-10-15 NOTE — Telephone Encounter (Signed)
Please schedule patient for follow-up on diabetes.

## 2017-10-15 NOTE — Telephone Encounter (Signed)
Insulin Glargine Lantus refill Last OV:04/19/17-noted to increase insulin Last refill:09/02/16 (expired); will need new Rx with current dosage ordered ZOX:WRUEPCP:Mani Pharmacy: [id number] [place], [place] - [address] [phone] (Phone) [phone] (Fax)

## 2017-10-26 ENCOUNTER — Other Ambulatory Visit: Payer: Self-pay | Admitting: Urgent Care

## 2017-10-26 DIAGNOSIS — IMO0001 Reserved for inherently not codable concepts without codable children: Secondary | ICD-10-CM

## 2017-10-26 DIAGNOSIS — E1165 Type 2 diabetes mellitus with hyperglycemia: Principal | ICD-10-CM

## 2017-10-26 DIAGNOSIS — Z794 Long term (current) use of insulin: Principal | ICD-10-CM

## 2017-10-26 NOTE — Telephone Encounter (Signed)
xigduo refill Last Refill:07/31/17 # 180 Last OV: 04/19/17 PCP: Jake Miller [facility] [address] [place]  Last A1C 9.9 on 04/19/17 No upcoming appointments noted

## 2018-02-05 ENCOUNTER — Other Ambulatory Visit: Payer: Self-pay | Admitting: Urgent Care

## 2018-02-05 DIAGNOSIS — E1165 Type 2 diabetes mellitus with hyperglycemia: Principal | ICD-10-CM

## 2018-02-05 DIAGNOSIS — IMO0001 Reserved for inherently not codable concepts without codable children: Secondary | ICD-10-CM

## 2018-02-05 DIAGNOSIS — Z794 Long term (current) use of insulin: Principal | ICD-10-CM

## 2018-02-05 NOTE — Telephone Encounter (Signed)
LOV 04-19-17 with Wallis BambergMario Mani / Patient to see Dr. Creta LevinStallings on 02-20-17 / Requesting insulin refill

## 2018-02-05 NOTE — Telephone Encounter (Signed)
Copied from CRM [id number]. Topic: Quick Communication - Rx Refill/Question >> Feb 05, 2018 11:47 AM Wyonia HoughJohnson, Chaz E wrote: Medication: Insulin Glargine (BASAGLAR KWIKPEN) 100 UNIT/ML SOPN  Pt has enough to last thru the end of the week   Has the patient contacted their pharmacy? Yes.    Preferred Pharmacy (with phone number or street name): [id number]- [place], [place] - [address] [phone] (Phone) [phone] (Fax)    Agent: Please be advised that RX refills may take up to 3 business days. We ask that you follow-up with your pharmacy.

## 2018-02-06 ENCOUNTER — Encounter: Payer: Self-pay | Admitting: Family Medicine

## 2018-02-06 LAB — HM DIABETES EYE EXAM

## 2018-02-06 NOTE — Telephone Encounter (Signed)
Please advise 

## 2018-02-10 MED ORDER — BASAGLAR KWIKPEN 100 UNIT/ML ~~LOC~~ SOPN: PEN_INJECTOR | SUBCUTANEOUS | 0 refills | Status: DC

## 2018-02-20 ENCOUNTER — Other Ambulatory Visit: Payer: Self-pay

## 2018-02-20 ENCOUNTER — Encounter: Payer: Self-pay | Admitting: Family Medicine

## 2018-02-20 ENCOUNTER — Encounter

## 2018-02-20 ENCOUNTER — Ambulatory Visit: Payer: Commercial Managed Care - PPO | Admitting: Family Medicine

## 2018-02-20 VITALS — BP 124/84 | HR 74 | Temp 98.6°F | Resp 17 | Ht 67.5 in | Wt 184.2 lb

## 2018-02-20 DIAGNOSIS — E1169 Type 2 diabetes mellitus with other specified complication: Secondary | ICD-10-CM

## 2018-02-20 DIAGNOSIS — I10 Essential (primary) hypertension: Secondary | ICD-10-CM

## 2018-02-20 DIAGNOSIS — A048 Other specified bacterial intestinal infections: Secondary | ICD-10-CM | POA: Diagnosis not present

## 2018-02-20 DIAGNOSIS — E119 Type 2 diabetes mellitus without complications: Secondary | ICD-10-CM

## 2018-02-20 DIAGNOSIS — Z794 Long term (current) use of insulin: Secondary | ICD-10-CM | POA: Diagnosis not present

## 2018-02-20 DIAGNOSIS — IMO0001 Reserved for inherently not codable concepts without codable children: Secondary | ICD-10-CM

## 2018-02-20 DIAGNOSIS — Z23 Encounter for immunization: Secondary | ICD-10-CM | POA: Diagnosis not present

## 2018-02-20 DIAGNOSIS — E785 Hyperlipidemia, unspecified: Secondary | ICD-10-CM

## 2018-02-20 DIAGNOSIS — E1165 Type 2 diabetes mellitus with hyperglycemia: Secondary | ICD-10-CM

## 2018-02-20 LAB — COMPREHENSIVE METABOLIC PANEL
A/G RATIO: 1.6 (ref 1.2–2.2)
ALBUMIN: 4.2 g/dL (ref 3.5–5.5)
ALK PHOS: 60 IU/L (ref 39–117)
ALT: 23 IU/L (ref 0–44)
AST: 15 IU/L (ref 0–40)
BUN / CREAT RATIO: 17 (ref 9–20)
BUN: 17 mg/dL (ref 6–24)
Bilirubin Total: 0.6 mg/dL (ref 0.0–1.2)
CHLORIDE: 104 mmol/L (ref 96–106)
CO2: 23 mmol/L (ref 20–29)
Calcium: 9.6 mg/dL (ref 8.7–10.2)
Creatinine, Ser: 0.99 mg/dL (ref 0.76–1.27)
GFR calc Af Amer: 105 mL/min/{1.73_m2} (ref >59)
GFR calc non Af Amer: 91 mL/min/{1.73_m2} (ref >59)
GLUCOSE: 162 mg/dL — AB (ref 65–99)
Globulin, Total: 2.7 g/dL (ref 1.5–4.5)
POTASSIUM: 4.8 mmol/L (ref 3.5–5.2)
SODIUM: 141 mmol/L (ref 134–144)
Total Protein: 6.9 g/dL (ref 6.0–8.5)

## 2018-02-20 LAB — POCT CBC
Granulocyte percent: 70.4 %G (ref 37–80)
HCT, POC: 44.6 % — AB (ref 29–41)
Hemoglobin: 15.7 g/dL — AB (ref 11–14.6)
Lymph, poc: 2.1 (ref 0.6–3.4)
MCH, POC: 30.5 pg (ref 27–31.2)
MCHC: 35.3 g/dL (ref 31.8–35.4)
MCV: 86.4 fL (ref 76–111)
MID (cbc): 0.5 (ref 0–0.9)
MPV: 7.6 fL (ref 0–99.8)
POC Granulocyte: 6.1 (ref 2–6.9)
POC LYMPH PERCENT: 24.4 %L (ref 10–50)
POC MID %: 5.2 %M (ref 0–12)
Platelet Count, POC: 286 10*3/uL (ref 142–424)
RBC: 5.16 M/uL (ref 4.69–6.13)
RDW, POC: 13.1 %
WBC: 8.7 10*3/uL (ref 4.6–10.2)

## 2018-02-20 LAB — POCT GLYCOSYLATED HEMOGLOBIN (HGB A1C): HEMOGLOBIN A1C: 8.9 % — AB (ref 4.0–5.6)

## 2018-02-20 LAB — LIPID PANEL
Chol/HDL Ratio: 6.1 ratio — ABNORMAL HIGH (ref 0.0–5.0)
Cholesterol, Total: 184 mg/dL (ref 100–199)
HDL: 30 mg/dL — AB (ref >39)
LDL Calculated: 113 mg/dL — ABNORMAL HIGH (ref 0–99)
Triglycerides: 206 mg/dL — ABNORMAL HIGH (ref 0–149)
VLDL Cholesterol Cal: 41 mg/dL — ABNORMAL HIGH (ref 5–40)

## 2018-02-20 MED ORDER — DAPAGLIFLOZIN PRO-METFORMIN ER 5-1000 MG PO TB24: 1.0000 | ORAL_TABLET | Freq: Every day | ORAL | 1 refills | Status: DC

## 2018-02-20 MED ORDER — DULAGLUTIDE 1.5 MG/0.5ML ~~LOC~~ SOAJ: 1.5000 mg | SUBCUTANEOUS | 2 refills | Status: DC

## 2018-02-20 MED ORDER — ATORVASTATIN CALCIUM 20 MG PO TABS: 20.0000 mg | ORAL_TABLET | Freq: Every day | ORAL | 3 refills | Status: DC

## 2018-02-20 MED ORDER — ONETOUCH LANCETS MISC: 3 refills | Status: AC

## 2018-02-20 MED ORDER — LISINOPRIL 10 MG PO TABS: 10.0000 mg | ORAL_TABLET | Freq: Every day | ORAL | 1 refills | Status: DC

## 2018-02-20 MED ORDER — FAMOTIDINE 20 MG PO TABS: 20.0000 mg | ORAL_TABLET | Freq: Two times a day (BID) | ORAL | 11 refills | Status: DC

## 2018-02-20 NOTE — Assessment & Plan Note (Signed)
Will check for test of cure

## 2018-02-20 NOTE — Assessment & Plan Note (Signed)
UNcontrolled hemoglobin a1c is IMPROVING Continue exercise Lipids monitored and renal function in range On XIGDUO On ace  On asa 81mg  Reviewed diabetic foot care Emphasized importance of eye and dental exam  WILL CHANGE LANTUS TO TRULICITY DISCUSSED MOA OF TRULICITY AND THE IMPORTANCE OF COMPLIANCE WITH WEEKLY DOSING

## 2018-02-20 NOTE — Assessment & Plan Note (Signed)
Patient's blood pressure is at goal of 139/89 or less. Condition is stable. Continue current medications and treatment plan. I recommend that you exercise for 30-45 minutes 5 days a week. I also recommend a balanced diet with fruits and vegetables every day, lean meats, and little fried foods. The DASH diet (you can find this online) is a good example of this.  

## 2018-02-20 NOTE — Assessment & Plan Note (Signed)
Discussed medications that affect lipids Reminded patient to avoid grapefruits Reviewed last 3 lipids Discussed current meds: statin, aspirin Advised dietary fiber and fish oil and ways to keep HDL high CAD prevention and reviewed side effects of statins   

## 2018-02-20 NOTE — Progress Notes (Signed)
Established Patient Office Visit  Subjective:  Patient ID: Jake Miller, male    DOB: September 21, 1970  Age: 47 y.o. MRN: [id number]  CC:  Chief Complaint  Patient presents with  . Establish Care    diabetes f/u  . Medication Refill    atorvastatin, cyclobenzaprine, xigduo xr, pepcid, basaglar kwikpen, lancets, lisinopril, omeprazole  . Per pt having pain in left heel when bending    x 2 week, no pain with walking only with different movements-bending    HPI Jake Miller presents for   Diabetes Mellitus: Patient presents for follow up of diabetes. Symptoms: none. Symptoms have stabilized. Patient denies foot ulcerations, hyperglycemia, hypoglycemia , increase appetite, nausea and paresthesia of the feet.  Evaluation to date has been included: hemoglobin A1C.  Home sugars: BGs are running  consistent with Hgb A1C.    Was previously on insulin 50units Then decreased his dose to 40 units which he discussed with his PCP who adjusted it to 40 units He also needs a refill of his xigduo and lantus 45 units  He reports that he does not get  If he does not eat at night his morning sugars are 75-80 He states that he works late and has a night snack before bed but if he has a snack his blood sugars are close to 200.  He eats a small breakfast of tea, then eats a lunch then dinner before going to work  Aspirin therapy? yes Statin therapy? yes Ace inhibitor? Yes  H. Pylori and GERD He was diagnosed with H. Pylori and was treated with antibiotics and PPI Reports that he continues to have some burning sensation in his lower abdomen He takes intermittent pepcid He drinks teas which does not help but also does sodas which seems to helps He eats at night but that doesn't seem to worsen his symptoms He smokes hooka  Dyslipidemia: Patient presents for evaluation of lipids.  Compliance with treatment thus far has been excellent.  A repeat fasting lipid profile was done.  The patient does not use  medications that may worsen dyslipidemias (corticosteroids, progestins, anabolic steroids, diuretics, beta-blockers, amiodarone, cyclosporine, olanzapine). The patient exercises intermittently.  The patient is not known to have coexisting coronary artery disease.   Lab Results  Component Value Date   CHOL 146 09/02/2016   CHOL 202 (H) 02/26/2016   CHOL 170 02/21/2014   Lab Results  Component Value Date   HDL 31 (L) 09/02/2016   HDL 30 (L) 02/26/2016   HDL 32 (L) 02/21/2014   Lab Results  Component Value Date   LDLCALC 77 09/02/2016   LDLCALC 130 (H) 02/26/2016   LDLCALC 106 (H) 02/21/2014   Lab Results  Component Value Date   TRIG 189 (H) 09/02/2016   TRIG 209 (H) 02/26/2016   TRIG 158 (H) 02/21/2014   Lab Results  Component Value Date   CHOLHDL 4.7 09/02/2016   CHOLHDL 6.7 (H) 02/26/2016   CHOLHDL 5.3 02/21/2014   No results found for: LDLDIRECT  Hypertension: Patient here for follow-up of elevated blood pressure. He is exercising and is adherent to low salt diet.  Blood pressure is well controlled at home. Cardiac symptoms none. Patient denies chest pain, chest pressure/discomfort, claudication, dyspnea, fatigue and irregular heart beat.  Cardiovascular risk factors: diabetes mellitus, dyslipidemia and hypertension. Use of agents associated with hypertension: none. History of target organ damage: none. BP Readings from Last 3 Encounters:  02/20/18 124/84  04/19/17 105/70  09/02/16 129/83  Past Medical History:  Diagnosis Date  . Diabetes mellitus   . GERD (gastroesophageal reflux disease)   . Hypertension     No past surgical history on file.  No family history on file.  Social History   Socioeconomic History  . Marital status: Married    Spouse name: Not on file  . Number of children: Not on file  . Years of education: Not on file  . Highest education level: Not on file  Occupational History  . Not on file  Social Needs  . Financial resource  strain: Not on file  . Food insecurity:    Worry: Not on file    Inability: Not on file  . Transportation needs:    Medical: Not on file    Non-medical: Not on file  Tobacco Use  . Smoking status: Former Research scientist (life sciences)  . Smokeless tobacco: Current User  Substance and Sexual Activity  . Alcohol use: No    Alcohol/week: 0.0 standard drinks  . Drug use: No  . Sexual activity: Yes  Lifestyle  . Physical activity:    Days per week: Not on file    Minutes per session: Not on file  . Stress: Not on file  Relationships  . Social connections:    Talks on phone: Not on file    Gets together: Not on file    Attends religious service: Not on file    Active member of club or organization: Not on file    Attends meetings of clubs or organizations: Not on file    Relationship status: Not on file  . Intimate partner violence:    Fear of current or ex partner: Not on file    Emotionally abused: Not on file    Physically abused: Not on file    Forced sexual activity: Not on file  Other Topics Concern  . Not on file  Social History Narrative  . Not on file    Outpatient Medications Prior to Visit  Medication Sig Dispense Refill  . blood glucose meter kit and supplies KIT Check blood sugar in morning before eating breakfast, and one hour after a meal. Dx codes: E11.9, Z79.4. 1 each 0  . cyclobenzaprine (FLEXERIL) 5 MG tablet Take 2 tablets (10 mg total) by mouth at bedtime. 30 tablet 6  . glucose blood (ONE TOUCH ULTRA TEST) test strip CHECK BLOOD SUGAR IN MORNING BEFORE EATING BREAKFAST AND 1 HOUR AFTER A MEAL 100 each 3  . glucose blood test strip Check blood sugar in morning before eating breakfast, and one hour after a meal. Dx codes: E11.9, Z79.4. 200 each 3  . amoxicillin (AMOXIL) 500 MG capsule Take 2 capsules (1,000 mg total) by mouth 2 (two) times daily. 56 capsule 0  . atorvastatin (LIPITOR) 20 MG tablet TAKE 1 TABLET BY MOUTH EVERY DAY 30 tablet 0  . clarithromycin (BIAXIN) 500 MG  tablet Take 1 tablet (500 mg total) by mouth 2 (two) times daily. 28 tablet 0  . famotidine (PEPCID) 20 MG tablet TAKE 1 TABLET (20 MG TOTAL) BY MOUTH 2 (TWO) TIMES DAILY BEFORE A MEAL. 60 tablet 0  . Insulin Glargine (BASAGLAR KWIKPEN) 100 UNIT/ML SOPN INJECT 40 UNITS AT BEDTIME. NEEDS OFFICE VISIT FOR REFILLS. 5 pen 0  . lisinopril (PRINIVIL,ZESTRIL) 10 MG tablet TAKE 1 TABLET (10 MG TOTAL) BY MOUTH DAILY. OFFICE VISIT NEEDED 30 tablet 0  . omeprazole (PRILOSEC) 20 MG capsule Take 1 capsule (20 mg total) by mouth 2 (two) times daily  before a meal. 60 capsule 1  . ONE TOUCH LANCETS MISC Check blood sugar in morning before eating breakfast, and one hour after a meal. Dx codes: E11.9, Z79.4. 200 each 3  . XIGDUO XR 07-998 MG TB24 TAKE 1 TABLET BY MOUTH 2 (TWO) TIMES DAILY WITH A MEAL. 180 tablet 1   No facility-administered medications prior to visit.     No Known Allergies  ROS Review of Systems Review of Systems  Constitutional: Negative for activity change, appetite change, chills and fever.  HENT: Negative for congestion, nosebleeds, trouble swallowing and voice change.   Respiratory: Negative for cough, shortness of breath and wheezing.   Gastrointestinal: Negative for diarrhea, nausea and vomiting.  Genitourinary: Negative for difficulty urinating, dysuria, flank pain and hematuria.  Musculoskeletal: Negative for back pain, joint swelling and neck pain.  Neurological: Negative for dizziness, speech difficulty, light-headedness and numbness.  See HPI. All other review of systems negative.     Objective:    Physical Exam  BP 124/84 (BP Location: Right Arm, Patient Position: Sitting, Cuff Size: Normal)   Pulse 74   Temp 98.6 F (37 C) (Oral)   Resp 17   Ht 5' 7.5" (1.715 m)   Wt 184 lb 3.2 oz (83.6 kg)   SpO2 98%   BMI 28.42 kg/m  Wt Readings from Last 3 Encounters:  02/20/18 184 lb 3.2 oz (83.6 kg)  04/19/17 183 lb 9.6 oz (83.3 kg)  09/02/16 11 lb 4.6 oz (5.12 kg)    Physical Exam  Constitutional: Oriented to person, place, and time. Appears well-developed and well-nourished.  HENT:  Head: Normocephalic and atraumatic.  Eyes: Conjunctivae and EOM are normal.  Cardiovascular: Normal rate, regular rhythm, normal heart sounds and intact distal pulses.  No murmur heard. Pulmonary/Chest: Effort normal and breath sounds normal. No stridor. No respiratory distress. Has no wheezes.  Neurological: Is alert and oriented to person, place, and time.  Skin: Skin is warm. Capillary refill takes less than 2 seconds.  Psychiatric: Has a normal mood and affect. Behavior is normal. Judgment and thought content normal.    Health Maintenance Due  Topic Date Due  . PNEUMOCOCCAL POLYSACCHARIDE VACCINE AGE 68-64 HIGH RISK  03/06/1973  . TETANUS/TDAP  03/06/1990  . OPHTHALMOLOGY EXAM  05/23/2016  . FOOT EXAM  09/02/2017  . INFLUENZA VACCINE  10/04/2017  . HEMOGLOBIN A1C  10/17/2017    There are no preventive care reminders to display for this patient.  Lab Results  Component Value Date   TSH 1.920 04/19/2017   Lab Results  Component Value Date   WBC 8.7 02/20/2018   HGB 15.7 (A) 02/20/2018   HCT 44.6 (A) 02/20/2018   MCV 86.4 02/20/2018   PLT 285 04/19/2017   Lab Results  Component Value Date   NA 136 04/19/2017   K 4.7 04/19/2017   CO2 20 04/19/2017   GLUCOSE 294 (H) 04/19/2017   BUN 15 04/19/2017   CREATININE 1.00 04/19/2017   BILITOT 0.5 04/19/2017   ALKPHOS 71 04/19/2017   AST 19 04/19/2017   ALT 34 04/19/2017   PROT 7.5 04/19/2017   ALBUMIN 4.4 04/19/2017   CALCIUM 9.3 04/19/2017   Lab Results  Component Value Date   CHOL 146 09/02/2016   Lab Results  Component Value Date   HDL 31 (L) 09/02/2016   Lab Results  Component Value Date   LDLCALC 77 09/02/2016   Lab Results  Component Value Date   TRIG 189 (H) 09/02/2016   Lab  Results  Component Value Date   CHOLHDL 4.7 09/02/2016   Lab Results  Component Value Date   HGBA1C  8.9 (A) 02/20/2018      Assessment & Plan:   Problem List Items Addressed This Visit      Cardiovascular and Mediastinum   Essential hypertension - Primary    Patient's blood pressure is at goal of 139/89 or less. Condition is stable. Continue current medications and treatment plan. I recommend that you exercise for 30-45 minutes 5 days a week. I also recommend a balanced diet with fruits and vegetables every day, lean meats, and little fried foods. The DASH diet (you can find this online) is a good example of this.        Relevant Medications   lisinopril (PRINIVIL,ZESTRIL) 10 MG tablet   atorvastatin (LIPITOR) 20 MG tablet   Other Relevant Orders   Comprehensive metabolic panel     Endocrine   Uncontrolled type 2 diabetes mellitus without complication, with long-term current use of insulin (HCC)    UNcontrolled hemoglobin a1c is IMPROVING Continue exercise Lipids monitored and renal function in range On XIGDUO On ace  On asa 36m Reviewed diabetic foot care Emphasized importance of eye and dental exam  WILL CHANGE LANTUS TO TRULICITY DISCUSSED MOA OF TRULICITY AND THE IMPORTANCE OF COMPLIANCE WITH WEEKLY DOSING       Relevant Medications   Dulaglutide (TRULICITY) 1.5 MPN/3.6RWSOPN   Dapagliflozin-metFORMIN HCl ER (XIGDUO XR) 07-998 MG TB24   lisinopril (PRINIVIL,ZESTRIL) 10 MG tablet   atorvastatin (LIPITOR) 20 MG tablet   Type 2 diabetes mellitus with hyperlipidemia (HCC)    Discussed medications that affect lipids Reminded patient to avoid grapefruits Reviewed last 3 lipids Discussed current meds: statin, aspirin Advised dietary fiber and fish oil and ways to keep HDL high CAD prevention and reviewed side effects of statins       Relevant Medications   Dulaglutide (TRULICITY) 1.5 MER/1.5QMSOPN   Dapagliflozin-metFORMIN HCl ER (XIGDUO XR) 07-998 MG TB24   lisinopril (PRINIVIL,ZESTRIL) 10 MG tablet   atorvastatin (LIPITOR) 20 MG tablet   Other Relevant  Orders   POCT glycosylated hemoglobin (Hb A1C) (Completed)   [facility] Diabetes Foot Exam (Completed)   Comprehensive metabolic panel   Lipid panel     Other   H. pylori infection    Will check for test of cure      Relevant Orders   H. pylori breath test    Other Visit Diagnoses    Diabetes mellitus type 2, insulin dependent (HCC)       Relevant Medications   Dulaglutide (TRULICITY) 1.5 MGQ/6.7YPSOPN   Dapagliflozin-metFORMIN HCl ER (XIGDUO XR) 07-998 MG TB24   lisinopril (PRINIVIL,ZESTRIL) 10 MG tablet   atorvastatin (LIPITOR) 20 MG tablet   Other Relevant Orders   POCT glycosylated hemoglobin (Hb A1C) (Completed)   HM Diabetes Foot Exam (Completed)   Comprehensive metabolic panel   POCT CBC (Completed)   Lipid panel   Need for pneumococcal vaccination          Meds ordered this encounter  Medications  . Dulaglutide (TRULICITY) 1.5 MPJ/0.9TOSOPN    Sig: Inject 1.5 mg into the skin once a week.    Dispense:  4 pen    Refill:  2  . Dapagliflozin-metFORMIN HCl ER (XIGDUO XR) 07-998 MG TB24    Sig: Take 1 tablet by mouth daily with breakfast.    Dispense:  90 tablet    Refill:  1  .  lisinopril (PRINIVIL,ZESTRIL) 10 MG tablet    Sig: Take 1 tablet (10 mg total) by mouth daily. Office visit needed    Dispense:  90 tablet    Refill:  1  . famotidine (PEPCID) 20 MG tablet    Sig: Take 1 tablet (20 mg total) by mouth 2 (two) times daily before a meal.    Dispense:  60 tablet    Refill:  11  . atorvastatin (LIPITOR) 20 MG tablet    Sig: Take 1 tablet (20 mg total) by mouth daily.    Dispense:  90 tablet    Refill:  3  . ONE TOUCH LANCETS MISC    Sig: Check blood sugar in morning before eating breakfast, and one hour after a meal. Dx codes: E11.9, Z79.4.    Dispense:  200 each    Refill:  3    Follow-up: No follow-ups on file.    Forrest Moron, MD

## 2018-02-20 NOTE — Patient Instructions (Addendum)
   If you have lab work done today you will be contacted with your lab results within the next 2 weeks.  If you have not heard from us then please contact us. The fastest way to get your results is to register for My Chart.   IF you received an x-ray today, you will receive an invoice from [facility]. Please contact [facility] at [phone] with questions or concerns regarding your invoice.   IF you received labwork today, you will receive an invoice from [facility]. Please contact [facility] at [phone] with questions or concerns regarding your invoice.   Our billing staff will not be able to assist you with questions regarding bills from these companies.  You will be contacted with the lab results as soon as they are available. The fastest way to get your results is to activate your My Chart account. Instructions are located on the last page of this paperwork. If you have not heard from us regarding the results in 2 weeks, please contact this office.      Helicobacter Pylori Infection Helicobacter pylori infection is a bacterial infection in the stomach. Long-term (chronic) infection can cause stomach irritation (gastritis), ulcers in the stomach (gastric ulcers), and ulcers in the upper part of the intestine (duodenal ulcers). Having this infection may also increase your risk of stomach cancer and a type of white blood cell cancer (lymphoma) that affects the stomach. What are the causes? This infection is caused by the Helicobacter pylori (H. pylori) bacteria. Many healthy people have this bacteria in their stomach lining. The bacteria may also spread from person to person through contact with stool (feces) or saliva. It is not known why some people develop ulcers, gastritis, or cancer from the bacteria. What increases the risk? You are more likely to develop this condition if you:  Have family members with the infection.  Live with many other people, such as in a  dormitory.  Are of African, Hispanic, or Asian descent. What are the signs or symptoms? Most people with this infection do not have any symptoms. If you do have symptoms, they may include:  Heartburn.  Stomach pain.  Nausea.  Vomiting. The vomit may be bloody because of ulcers.  Loss of appetite.  Bad breath. How is this diagnosed? This condition may be diagnosed based on:  Your symptoms and medical history.  A physical exam.  Blood tests.  Stool tests.  A breath test.  A procedure that involves placing a tube with a camera on the end of it down your throat to examine your stomach and upper intestine (upper endoscopy).  Removing and testing a tissue sample from the stomach lining (biopsy). A biopsy may be taken during an upper endoscopy. How is this treated?  This condition is treated by taking a combination of medicines (triple therapy) for several weeks. Triple therapy includes one medicine to reduce the amount of acid in your stomach and two types of antibiotic medicines. This treatment may reduce your risk of cancer. You may need to be tested for H. pylori again after treatment. In some cases, the treatment may need to be repeated if your treatment did not get rid of all the bacteria. Follow these instructions at home:   Take over-the-counter and prescription medicines only as told by your health care provider.  Take your antibiotics as told by your health care provider. Do not stop taking the antibiotics even if you start to feel better.  Return to your normal   activities as told by your health care provider. Ask your health care provider what activities are safe for you.  Take steps to prevent future infections: ? Wash your hands often with soap and water. If soap and water are not available, use hand sanitizer. ? Do not eat food or drink water that may have had contact with stool or saliva.  Keep all follow-up visits as told by your health care provider. This  is important. You may need tests to make sure your treatment worked. Contact a health care provider if your symptoms:  Do not get better with treatment.  Return after treatment. Summary  Helicobacter pylori infection is a stomach infection caused by the Helicobacter pylori (H. pylori) bacteria.  This infection can cause stomach irritation (gastritis), ulcers in the stomach (gastric ulcers), and ulcers in the upper part of the intestine (duodenal ulcers).  This condition is treated by taking a combination of medicines (triple therapy) for several weeks.  Take your antibiotics as told by your health care provider. Do not stop taking the antibiotics even if you start to feel better. This information is not intended to replace advice given to you by your health care provider. Make sure you discuss any questions you have with your health care provider. Document Released: 06/14/2015 Document Revised: 02/13/2017 Document Reviewed: 02/13/2017 Elsevier Interactive Patient Education  2019 [employer]  

## 2018-02-22 ENCOUNTER — Other Ambulatory Visit: Payer: Self-pay | Admitting: Urgent Care

## 2018-02-22 DIAGNOSIS — E1165 Type 2 diabetes mellitus with hyperglycemia: Principal | ICD-10-CM

## 2018-02-22 DIAGNOSIS — IMO0001 Reserved for inherently not codable concepts without codable children: Secondary | ICD-10-CM

## 2018-02-22 DIAGNOSIS — Z794 Long term (current) use of insulin: Principal | ICD-10-CM

## 2018-02-28 ENCOUNTER — Telehealth: Payer: Self-pay | Admitting: Family Medicine

## 2018-02-28 NOTE — Telephone Encounter (Signed)
Prior Auth has denied Trulicity 1.5MG /0.5ML Pen-injectors  Please advise  Thank you!

## 2018-03-05 MED ORDER — INSULIN GLARGINE 100 UNIT/ML SOLOSTAR PEN: 50.0000 [IU] | PEN_INJECTOR | Freq: Every day | SUBCUTANEOUS | 3 refills | Status: DC

## 2018-03-05 NOTE — Telephone Encounter (Signed)
Please let the patient know that he should continue the lantus daily since the weekly trulicity is not covered by his insurance.

## 2018-03-05 NOTE — Addendum Note (Signed)
Addended by: Collie SiadSTALLINGS, Keno Caraway A on: 03/05/2018 10:39 AM   Modules accepted: Orders

## 2018-03-05 NOTE — Telephone Encounter (Signed)
I REFILLED HIS LANTUS

## 2018-03-08 ENCOUNTER — Telehealth: Payer: Self-pay | Admitting: Family Medicine

## 2018-03-08 MED ORDER — INSULIN GLARGINE 100 UNIT/ML SOLOSTAR PEN: 50.0000 [IU] | PEN_INJECTOR | Freq: Every day | SUBCUTANEOUS | 0 refills | Status: DC

## 2018-03-08 NOTE — Telephone Encounter (Signed)
Spoke with Summer at [facility] pharmacy who states that in order for pt to receive close to a 3 month supply the dispensed amount would need to be 45 pens. Medication will be resent to the pharmacy so that the pt would be able to receive a 3 month supply of the medication.

## 2018-03-08 NOTE — Telephone Encounter (Signed)
Pt wants to know why he can't get a 90 day supply of this medication.  Pt states he can't afford it to be sent in one month at a time.  Pt wants to know what he is supposed to do. Pt can be reached at [phone].

## 2018-03-08 NOTE — Telephone Encounter (Signed)
Copied from CRM [phone]. Topic: Quick Communication - See Telephone Encounter >> Mar 08, 2018  9:28 AM Arlyss Gandyichardson, Hanaan Gancarz N, NT wrote: CRM for notification. See Telephone encounter for: 03/08/18. Pt states that his insurance will not cover the Insulin Glargine (LANTUS SOLOSTAR) 100 UNIT/ML Solostar Pen unless written for a 3 month supply. Please advise.

## 2018-03-08 NOTE — Addendum Note (Signed)
Addended by: Amado Coe on: 03/08/2018 04:44 PM   Modules accepted: Orders

## 2018-03-12 ENCOUNTER — Encounter: Payer: Self-pay | Admitting: Family Medicine

## 2018-03-13 ENCOUNTER — Telehealth: Payer: Self-pay

## 2018-03-13 NOTE — Telephone Encounter (Signed)
Lab letter sent via mail to pt home address. Dgaddy, CMA 

## 2018-04-25 ENCOUNTER — Other Ambulatory Visit: Payer: Self-pay

## 2018-04-25 ENCOUNTER — Ambulatory Visit (INDEPENDENT_AMBULATORY_CARE_PROVIDER_SITE_OTHER): Payer: Commercial Managed Care - PPO | Admitting: Family Medicine

## 2018-04-25 ENCOUNTER — Encounter: Payer: Self-pay | Admitting: Family Medicine

## 2018-04-25 VITALS — BP 123/84 | HR 79 | Temp 98.1°F | Resp 20 | Ht 67.72 in | Wt 178.2 lb

## 2018-04-25 DIAGNOSIS — E1165 Type 2 diabetes mellitus with hyperglycemia: Secondary | ICD-10-CM | POA: Diagnosis not present

## 2018-04-25 DIAGNOSIS — D582 Other hemoglobinopathies: Secondary | ICD-10-CM

## 2018-04-25 DIAGNOSIS — IMO0001 Reserved for inherently not codable concepts without codable children: Secondary | ICD-10-CM

## 2018-04-25 DIAGNOSIS — I10 Essential (primary) hypertension: Secondary | ICD-10-CM

## 2018-04-25 DIAGNOSIS — Z794 Long term (current) use of insulin: Secondary | ICD-10-CM

## 2018-04-25 DIAGNOSIS — E119 Type 2 diabetes mellitus without complications: Secondary | ICD-10-CM

## 2018-04-25 LAB — POCT GLYCOSYLATED HEMOGLOBIN (HGB A1C): Hemoglobin A1C: 9.6 % — AB (ref 4.0–5.6)

## 2018-04-25 MED ORDER — LISINOPRIL 10 MG PO TABS: 10.0000 mg | ORAL_TABLET | Freq: Every day | ORAL | 1 refills | Status: DC

## 2018-04-25 MED ORDER — DAPAGLIFLOZIN PRO-METFORMIN ER 5-1000 MG PO TB24: 1.0000 | ORAL_TABLET | Freq: Two times a day (BID) | ORAL | 0 refills | Status: DC

## 2018-04-25 MED ORDER — DULAGLUTIDE 1.5 MG/0.5ML ~~LOC~~ SOAJ: 1.5000 mg | SUBCUTANEOUS | 2 refills | Status: DC

## 2018-04-25 MED ORDER — CYCLOBENZAPRINE HCL 5 MG PO TABS: 10.0000 mg | ORAL_TABLET | Freq: Every day | ORAL | 1 refills | Status: AC

## 2018-04-25 MED ORDER — GLUCOSE BLOOD VI STRP: ORAL_STRIP | 3 refills | Status: DC

## 2018-04-25 MED ORDER — INSULIN GLARGINE 100 UNIT/ML SOLOSTAR PEN: 20.0000 [IU] | PEN_INJECTOR | Freq: Every day | SUBCUTANEOUS | 1 refills | Status: DC

## 2018-04-25 MED ORDER — ATORVASTATIN CALCIUM 20 MG PO TABS: 20.0000 mg | ORAL_TABLET | Freq: Every day | ORAL | 3 refills | Status: DC

## 2018-04-25 NOTE — Patient Instructions (Addendum)
Restart Lantus at 20 units at bedtime.  Use your fasting glucose in the morning to see how your sugars are doing.  If your sugars are still 200s then increase lantus by 2 units until your sugars are around 100-120 fasting  If your sugars are 60-80 then decrease your lantus by 2 units until you sugars are around 100 fasting    If you have lab work done today you will be contacted with your lab results within the next 2 weeks.  If you have not heard from [place] then please contact us. The fastest way to get your results is to register for My Chart.   IF you received an x-ray today, you will receive an invoice from [facility]. Please contact [facility] at [phone] with questions or concerns regarding your invoice.   IF you received labwork today, you will receive an invoice from [facility]. Please contact [facility] at [phone] with questions or concerns regarding your invoice.   Our billing staff will not be able to assist you with questions regarding bills from these companies.  You will be contacted with the lab results as soon as they are available. The fastest way to get your results is to activate your My Chart account. Instructions are located on the last page of this paperwork. If you have not heard from [place] regarding the results in 2 weeks, please contact this office.

## 2018-04-25 NOTE — Progress Notes (Signed)
Established Patient Office Visit  Subjective:  Patient ID: Jake Miller, male    DOB: 10-05-1970  Age: 48 y.o. MRN: [id number]  CC:  Chief Complaint  Patient presents with  . Diabetes  . Medication Refill    atorvastatin calcium, cycobenzaprine hci, dapagliflozin-metformin, dulaglutide, lantus, lisinopril    HPI Jake Miller presents for   Essential Hypertension He is taking lisinopril 32m and denies chest pains, palpitations or SOB He has a dry cough for about a week He states that he had some drainage with it He states that he takes nyquil at night for the dry cough to help for his sleep and coughing He has a history of muscle cramps BP Readings from Last 3 Encounters:  04/25/18 123/84  02/20/18 124/84  04/19/17 105/70    Diabetes Mellitus Insulin Dependent Difficulty with eating due to feeling like he cannot enjoy his food due to some cramps He feels like he cannot even eat the food He denies any diarrhea but gets bloating He feels hungry but does not have appetite for food He denies nausea or vomiting This started with the new insulin medication Wt Readings from Last 3 Encounters:  04/25/18 178 lb 3.2 oz (80.8 kg)  02/20/18 184 lb 3.2 oz (83.6 kg)  04/19/17 183 lb 9.6 oz (83.3 kg)   He needs a refill of the medication   He reports that his fasting glucose is around 200 He states that in the mornings he used to get blood glucose of 90-100 when he took lantus 50 units at night He reports that he would take milk and bread at night with his lantus 50 units at bedtime  Lab Results  Component Value Date   HGBA1C 8.9 (A) 02/20/2018    Muscle spasm-  Takes flexeril prn for muscle contractions   Past Medical History:  Diagnosis Date  . Diabetes mellitus   . GERD (gastroesophageal reflux disease)   . Hypertension     History reviewed. No pertinent surgical history.  History reviewed. No pertinent family history.  Social History   Socioeconomic History    . Marital status: Married    Spouse name: Not on file  . Number of children: 3  . Years of education: Not on file  . Highest education level: Not on file  Occupational History  . Not on file  Social Needs  . Financial resource strain: Not on file  . Food insecurity:    Worry: Not on file    Inability: Not on file  . Transportation needs:    Medical: Not on file    Non-medical: Not on file  Tobacco Use  . Smoking status: Former SResearch scientist (life sciences) . Smokeless tobacco: Current User  Substance and Sexual Activity  . Alcohol use: No    Alcohol/week: 0.0 standard drinks  . Drug use: No  . Sexual activity: Yes  Lifestyle  . Physical activity:    Days per week: Not on file    Minutes per session: Not on file  . Stress: Not on file  Relationships  . Social connections:    Talks on phone: Not on file    Gets together: Not on file    Attends religious service: Not on file    Active member of club or organization: Not on file    Attends meetings of clubs or organizations: Not on file    Relationship status: Not on file  . Intimate partner violence:    Fear of current or ex partner: Not  on file    Emotionally abused: Not on file    Physically abused: Not on file    Forced sexual activity: Not on file  Other Topics Concern  . Not on file  Social History Narrative  . Not on file    Outpatient Medications Prior to Visit  Medication Sig Dispense Refill  . blood glucose meter kit and supplies KIT Check blood sugar in morning before eating breakfast, and one hour after a meal. Dx codes: E11.9, Z79.4. 1 each 0  . famotidine (PEPCID) 20 MG tablet Take 1 tablet (20 mg total) by mouth 2 (two) times daily before a meal. 60 tablet 11  . Insulin Glargine (LANTUS SOLOSTAR) 100 UNIT/ML Solostar Pen Inject 50 Units into the skin daily. 45 pen 0  . ONE TOUCH LANCETS MISC Check blood sugar in morning before eating breakfast, and one hour after a meal. Dx codes: E11.9, Z79.4. 200 each 3  . atorvastatin  (LIPITOR) 20 MG tablet Take 1 tablet (20 mg total) by mouth daily. 90 tablet 3  . cyclobenzaprine (FLEXERIL) 5 MG tablet Take 2 tablets (10 mg total) by mouth at bedtime. 30 tablet 6  . Dulaglutide (TRULICITY) 1.5 OM/7.6HM SOPN Inject 1.5 mg into the skin once a week. 4 pen 2  . glucose blood (ONE TOUCH ULTRA TEST) test strip CHECK BLOOD SUGAR IN MORNING BEFORE EATING BREAKFAST AND 1 HOUR AFTER A MEAL 100 each 3  . glucose blood test strip Check blood sugar in morning before eating breakfast, and one hour after a meal. Dx codes: E11.9, Z79.4. 200 each 3  . lisinopril (PRINIVIL,ZESTRIL) 10 MG tablet Take 1 tablet (10 mg total) by mouth daily. Office visit needed 90 tablet 1  . XIGDUO XR 07-998 MG TB24 TAKE 1 TABLET BY MOUTH 2 (TWO) TIMES DAILY WITH A MEAL. 180 tablet 1   No facility-administered medications prior to visit.     No Known Allergies  ROS Review of Systems Review of Systems  Constitutional: Negative for activity change, appetite change, chills and fever.  HENT: Negative for congestion, nosebleeds, trouble swallowing and voice change.   Respiratory: SEE HPI Gastrointestinal: Negative for diarrhea, nausea and vomiting.  Genitourinary: Negative for difficulty urinating, dysuria, flank pain and hematuria.  Musculoskeletal: Negative for back pain, joint swelling and neck pain.  Neurological: Negative for dizziness, speech difficulty, light-headedness and numbness.  See HPI. All other review of systems negative.     Objective:    Physical Exam  BP 123/84   Pulse 79   Temp 98.1 F (36.7 C) (Oral)   Resp 20   Ht 5' 7.72" (1.72 m)   Wt 178 lb 3.2 oz (80.8 kg)   SpO2 97%   BMI 27.32 kg/m  Wt Readings from Last 3 Encounters:  04/25/18 178 lb 3.2 oz (80.8 kg)  02/20/18 184 lb 3.2 oz (83.6 kg)  04/19/17 183 lb 9.6 oz (83.3 kg)   Physical Exam  Constitutional: Oriented to person, place, and time. Appears well-developed and well-nourished.  HENT:  Head: Normocephalic and  atraumatic.  Eyes: Conjunctivae and EOM are normal.  Cardiovascular: Normal rate, regular rhythm, normal heart sounds and intact distal pulses.  No murmur heard. Pulmonary/Chest: Effort normal and breath sounds normal. No stridor. No respiratory distress. Has no wheezes.  Neurological: Is alert and oriented to person, place, and time.  Skin: Skin is warm. Capillary refill takes less than 2 seconds.  Psychiatric: Has a normal mood and affect. Behavior is normal. Judgment and thought  content normal.   Lab Results  Component Value Date   HGBA1C 9.6 (A) 04/25/2018     Health Maintenance Due  Topic Date Due  . TETANUS/TDAP  03/06/1990  . OPHTHALMOLOGY EXAM  05/23/2016  . INFLUENZA VACCINE  10/04/2017    There are no preventive care reminders to display for this patient.  Lab Results  Component Value Date   TSH 1.920 04/19/2017   Lab Results  Component Value Date   WBC 8.7 02/20/2018   HGB 15.7 (A) 02/20/2018   HCT 44.6 (A) 02/20/2018   MCV 86.4 02/20/2018   PLT 285 04/19/2017   Lab Results  Component Value Date   NA 141 02/20/2018   K 4.8 02/20/2018   CO2 23 02/20/2018   GLUCOSE 162 (H) 02/20/2018   BUN 17 02/20/2018   CREATININE 0.99 02/20/2018   BILITOT 0.6 02/20/2018   ALKPHOS 60 02/20/2018   AST 15 02/20/2018   ALT 23 02/20/2018   PROT 6.9 02/20/2018   ALBUMIN 4.2 02/20/2018   CALCIUM 9.6 02/20/2018   Lab Results  Component Value Date   CHOL 184 02/20/2018   Lab Results  Component Value Date   HDL 30 (L) 02/20/2018   Lab Results  Component Value Date   LDLCALC 113 (H) 02/20/2018   Lab Results  Component Value Date   TRIG 206 (H) 02/20/2018   Lab Results  Component Value Date   CHOLHDL 6.1 (H) 02/20/2018   Lab Results  Component Value Date   HGBA1C 8.9 (A) 02/20/2018      Assessment & Plan:   Problem List Items Addressed This Visit      Cardiovascular and Mediastinum   Essential hypertension Patient's blood pressure is at goal of  139/89 or less. Condition is stable. Continue current medications and treatment plan. I recommend that you exercise for 30-45 minutes 5 days a week. I also recommend a balanced diet with fruits and vegetables every day, lean meats, and little fried foods. The DASH diet (you can find this online) is a good example of this.    Relevant Medications   atorvastatin (LIPITOR) 20 MG tablet   lisinopril (PRINIVIL,ZESTRIL) 10 MG tablet     Endocrine   Uncontrolled type 2 diabetes mellitus without complication, with long-term current use of insulin ([place]) -   Discussed diabetes standard of care for diabetes -   Continue lipitor     Relevant Medications   atorvastatin (LIPITOR) 20 MG tablet   Dapagliflozin-metFORMIN HCl ER (XIGDUO XR) 07-998 MG TB24   Dulaglutide (TRULICITY) 1.5 EN/4.0HW SOPN   lisinopril (PRINIVIL,ZESTRIL) 10 MG tablet    Other Visit Diagnoses    Elevated hemoglobin (HCC)    -  Primary Hemoglobin back in normal range    Relevant Orders   CBC   Diabetes mellitus type 2, insulin dependent (HCC)      -  Deterioration of diabetes Added lantus back to regimen Gave goals to titrate Continue trulicity    Relevant Medications   atorvastatin (LIPITOR) 20 MG tablet   Dapagliflozin-metFORMIN HCl ER (XIGDUO XR) 07-998 MG TB24   Dulaglutide (TRULICITY) 1.5 KG/8.8PJ SOPN   lisinopril (PRINIVIL,ZESTRIL) 10 MG tablet   glucose blood (ONE TOUCH ULTRA TEST) test strip   Other Relevant Orders   CBC      Meds ordered this encounter  Medications  . atorvastatin (LIPITOR) 20 MG tablet    Sig: Take 1 tablet (20 mg total) by mouth daily.    Dispense:  90 tablet  Refill:  3  . cyclobenzaprine (FLEXERIL) 5 MG tablet    Sig: Take 2 tablets (10 mg total) by mouth at bedtime.    Dispense:  90 tablet    Refill:  1  . Dapagliflozin-metFORMIN HCl ER (XIGDUO XR) 07-998 MG TB24    Sig: Take 1 tablet by mouth 2 (two) times daily with a meal.    Dispense:  180 tablet    Refill:  0  .  Dulaglutide (TRULICITY) 1.5 BB/0.4UG SOPN    Sig: Inject 1.5 mg into the skin once a week.    Dispense:  4 pen    Refill:  2  . lisinopril (PRINIVIL,ZESTRIL) 10 MG tablet    Sig: Take 1 tablet (10 mg total) by mouth daily. Office visit needed    Dispense:  90 tablet    Refill:  1  . glucose blood (ONE TOUCH ULTRA TEST) test strip    Sig: CHECK BLOOD SUGAR IN MORNING BEFORE EATING BREAKFAST AND 1 HOUR AFTER A MEAL    Dispense:  100 each    Refill:  3    Follow-up: No follow-ups on file.    Forrest Moron, MD

## 2018-04-26 LAB — CMP14+EGFR
ALBUMIN: 4.5 g/dL (ref 4.0–5.0)
ALT: 15 IU/L (ref 0–44)
AST: 15 IU/L (ref 0–40)
Albumin/Globulin Ratio: 1.6 (ref 1.2–2.2)
Alkaline Phosphatase: 61 IU/L (ref 39–117)
BUN/Creatinine Ratio: 11 (ref 9–20)
BUN: 11 mg/dL (ref 6–24)
Bilirubin Total: 0.6 mg/dL (ref 0.0–1.2)
CO2: 22 mmol/L (ref 20–29)
CREATININE: 1.03 mg/dL (ref 0.76–1.27)
Calcium: 9.9 mg/dL (ref 8.7–10.2)
Chloride: 99 mmol/L (ref 96–106)
GFR calc Af Amer: 100 mL/min/{1.73_m2} (ref >59)
GFR calc non Af Amer: 86 mL/min/{1.73_m2} (ref >59)
Globulin, Total: 2.9 g/dL (ref 1.5–4.5)
Glucose: 228 mg/dL — ABNORMAL HIGH (ref 65–99)
Potassium: 4.7 mmol/L (ref 3.5–5.2)
Sodium: 138 mmol/L (ref 134–144)
Total Protein: 7.4 g/dL (ref 6.0–8.5)

## 2018-04-26 LAB — CBC
Hematocrit: 45.8 % (ref 37.5–51.0)
Hemoglobin: 16.5 g/dL (ref 13.0–17.7)
MCH: 29.9 pg (ref 26.6–33.0)
MCHC: 36 g/dL — ABNORMAL HIGH (ref 31.5–35.7)
MCV: 83 fL (ref 79–97)
Platelets: 266 10*3/uL (ref 150–450)
RBC: 5.52 x10E6/uL (ref 4.14–5.80)
RDW: 12.3 % (ref 11.6–15.4)
WBC: 7.2 10*3/uL (ref 3.4–10.8)

## 2018-04-30 ENCOUNTER — Encounter: Payer: Self-pay | Admitting: Radiology

## 2018-05-24 ENCOUNTER — Other Ambulatory Visit: Payer: Self-pay | Admitting: Family Medicine

## 2018-05-24 NOTE — Telephone Encounter (Signed)
Alternative requested Insurance will not cover prescribed medication

## 2018-06-04 ENCOUNTER — Telehealth: Payer: Commercial Managed Care - PPO | Admitting: Family Medicine

## 2018-06-17 ENCOUNTER — Ambulatory Visit: Payer: Commercial Managed Care - PPO | Admitting: Family Medicine

## 2018-06-23 ENCOUNTER — Other Ambulatory Visit: Payer: Self-pay | Admitting: Family Medicine

## 2018-06-25 ENCOUNTER — Other Ambulatory Visit: Payer: Self-pay

## 2018-06-25 ENCOUNTER — Telehealth: Payer: Self-pay | Admitting: Family Medicine

## 2018-06-25 ENCOUNTER — Encounter: Payer: Commercial Managed Care - PPO | Admitting: Family Medicine

## 2018-06-25 NOTE — Telephone Encounter (Signed)
06/25/2018 - PATIENT HAD A OFFICE VISIT SCHEDULED WITH DR. Creta Levin ON Tuesday 06/25/2018. HE WAS A NO-SHOW. I CALLED TO SEE IF WE COULD RESCHEDULE BUT I HAD TO LEAVE HIM A VOICE MAIL TO RETURN OUR CALL. MBC

## 2018-07-31 NOTE — Progress Notes (Signed)
This encounter was created in error - please disregard.

## 2018-09-27 ENCOUNTER — Other Ambulatory Visit: Payer: Self-pay | Admitting: Family Medicine

## 2018-09-27 DIAGNOSIS — IMO0001 Reserved for inherently not codable concepts without codable children: Secondary | ICD-10-CM

## 2018-09-27 NOTE — Telephone Encounter (Signed)
Forwarding medication refill to PCP for review. 

## 2018-11-12 ENCOUNTER — Other Ambulatory Visit: Payer: Self-pay | Admitting: Family Medicine

## 2018-11-12 DIAGNOSIS — I10 Essential (primary) hypertension: Secondary | ICD-10-CM

## 2018-11-12 NOTE — Telephone Encounter (Signed)
No further refills without office visit 

## 2018-11-12 NOTE — Telephone Encounter (Signed)
Requested medication (s) are due for refill today: yes  Requested medication (s) are on the active medication list: yes  Last refill:  08/13/2018  Future visit scheduled: no  Notes to clinic: review for refill  Requested Prescriptions  Pending Prescriptions Disp Refills   lisinopril (ZESTRIL) 10 MG tablet [Pharmacy Med Name: LISINOPRIL 10 MG TABLET] 90 tablet 1    Sig: TAKE 1 TABLET (10 MG TOTAL) BY MOUTH DAILY.     Cardiovascular:  ACE Inhibitors Failed - 11/12/2018  1:43 AM      Failed - Cr in normal range and within 180 days    Creat  Date Value Ref Range Status  04/22/2015 0.89 0.60 - 1.35 mg/dL Final   Creatinine, Ser  Date Value Ref Range Status  04/25/2018 1.03 0.76 - 1.27 mg/dL Final         Failed - K in normal range and within 180 days    Potassium  Date Value Ref Range Status  04/25/2018 4.7 3.5 - 5.2 mmol/L Final         Failed - Valid encounter within last 6 months    Recent Outpatient Visits          6 months ago Elevated hemoglobin ([place])   Primary Care at [place], [place]   8 months ago Essential hypertension   Primary Care at [place], [place]   1 year ago Generalized abdominal pain   Primary Care at [place], [place]   2 years ago Uncontrolled type 2 diabetes mellitus without complication, with long-term current use of insulin [facility])   Primary Care at [place], [place]   2 years ago Uncontrolled type 2 diabetes mellitus without complication, with long-term current use of insulin [facility])   Primary Care at [facility], [place], [place] - Patient is not pregnant      Passed - Last BP in normal range    BP Readings from Last 1 Encounters:  04/25/18 123/84

## 2018-11-17 ENCOUNTER — Other Ambulatory Visit: Payer: Self-pay | Admitting: Family Medicine

## 2018-11-18 NOTE — Telephone Encounter (Signed)
Requested medication (s) are due for refill today: yes  Requested medication (s) are on the active medication list: yes  Last refill:  10/20/2018  Future visit scheduled: no  Notes to clinic: review for refill   Requested Prescriptions  Pending Prescriptions Disp Refills   TRULICITY 1.5 QM/0.8QP SOPN [Pharmacy Med Name: TRULICITY 1.5 YP/9.5 ML PEN]  2    Sig: INJECT 1.5 MG INTO THE SKIN ONCE A WEEK.     Endocrinology:  Diabetes - GLP-1 Receptor Agonists Failed - 11/17/2018  9:50 AM      Failed - HBA1C is between 0 and 7.9 and within 180 days    Hemoglobin A1C  Date Value Ref Range Status  04/25/2018 9.6 (A) 4.0 - 5.6 % Final   Hgb A1c MFr Bld  Date Value Ref Range Status  04/19/2017 9.9 (H) 4.8 - 5.6 % Final    Comment:             Prediabetes: 5.7 - 6.4          Diabetes: >6.4          Glycemic control for adults with diabetes: <7.0          Failed - Valid encounter within last 6 months    Recent Outpatient Visits          6 months ago Elevated hemoglobin ([place])   Primary Care at [place], [place]   9 months ago Essential hypertension   Primary Care at [facility], [place] A, MD   1 year ago Generalized abdominal pain   Primary Care at [place], [place]   2 years ago Uncontrolled type 2 diabetes mellitus without complication, with long-term current use of insulin [facility])   Primary Care at [place], [place]   2 years ago Uncontrolled type 2 diabetes mellitus without complication, with long-term current use of insulin [facility])   Primary Care at [place], [place]

## 2018-11-22 NOTE — Telephone Encounter (Signed)
Pt requesting medication 

## 2018-12-07 ENCOUNTER — Other Ambulatory Visit: Payer: Self-pay | Admitting: Family Medicine

## 2018-12-07 DIAGNOSIS — E1165 Type 2 diabetes mellitus with hyperglycemia: Secondary | ICD-10-CM

## 2018-12-15 ENCOUNTER — Other Ambulatory Visit: Payer: Self-pay | Admitting: Family Medicine

## 2018-12-16 ENCOUNTER — Other Ambulatory Visit: Payer: Self-pay | Admitting: Family Medicine

## 2018-12-16 DIAGNOSIS — I10 Essential (primary) hypertension: Secondary | ICD-10-CM

## 2018-12-16 NOTE — Telephone Encounter (Signed)
Requested medication (s) are due for refill today:yes  Requested medication (s) are on the active medication list: yes  Last refill:  11/24/2018  Future visit scheduled: yes  Notes to clinic:  Patient has appointment on the 14th Review for refill   Requested Prescriptions  Pending Prescriptions Disp Refills   TRULICITY 1.5 YW/7.3XT SOPN [Pharmacy Med Name: TRULICITY 1.5 GG/2.6 ML PEN]  0    Sig: Inject 1.5mg  subcutaneously weekly. NEEDS OFFICE VISIT FOR REFILLS.     Endocrinology:  Diabetes - GLP-1 Receptor Agonists Failed - 12/15/2018 10:08 AM      Failed - HBA1C is between 0 and 7.9 and within 180 days    Hemoglobin A1C  Date Value Ref Range Status  04/25/2018 9.6 (A) 4.0 - 5.6 % Final   Hgb A1c MFr Bld  Date Value Ref Range Status  04/19/2017 9.9 (H) 4.8 - 5.6 % Final    Comment:             Prediabetes: 5.7 - 6.4          Diabetes: >6.4          Glycemic control for adults with diabetes: <7.0          Failed - Valid encounter within last 6 months    Recent Outpatient Visits          7 months ago Elevated hemoglobin ([place])   Primary Care at [place], [place]   9 months ago Essential hypertension   Primary Care at [facility], [place] A, MD   1 year ago Generalized abdominal pain   Primary Care at [place], [place]   2 years ago Uncontrolled type 2 diabetes mellitus without complication, with long-term current use of insulin [facility])   Primary Care at [place], [place]   2 years ago Uncontrolled type 2 diabetes mellitus without complication, with long-term current use of insulin [facility])   Primary Care at [place], [place]      Future Appointments            In 2 days Forrest Moron, MD Primary Care at [place], [facility]

## 2018-12-16 NOTE — Telephone Encounter (Signed)
Requested medication (s) are due for refill today: yes  Requested medication (s) are on the active medication list: yes  Last refill:  11/12/2018  Future visit scheduled: yes  Notes to clinic: Patient has appointment on 10/14   Requested Prescriptions  Pending Prescriptions Disp Refills   lisinopril (ZESTRIL) 10 MG tablet [Pharmacy Med Name: LISINOPRIL 10 MG TABLET] 30 tablet 0    Sig: TAKE 1 TABLET BY MOUTH EVERY DAY     Cardiovascular:  ACE Inhibitors Failed - 12/16/2018  9:30 AM      Failed - Cr in normal range and within 180 days    Creat  Date Value Ref Range Status  04/22/2015 0.89 0.60 - 1.35 mg/dL Final   Creatinine, Ser  Date Value Ref Range Status  04/25/2018 1.03 0.76 - 1.27 mg/dL Final         Failed - K in normal range and within 180 days    Potassium  Date Value Ref Range Status  04/25/2018 4.7 3.5 - 5.2 mmol/L Final         Failed - Valid encounter within last 6 months    Recent Outpatient Visits          7 months ago Elevated hemoglobin ([place])   Primary Care at [place], [place]   9 months ago Essential hypertension   Primary Care at [place], [place]   1 year ago Generalized abdominal pain   Primary Care at [place], [place]   2 years ago Uncontrolled type 2 diabetes mellitus without complication, with long-term current use of insulin [facility])   Primary Care at [place], [place]   2 years ago Uncontrolled type 2 diabetes mellitus without complication, with long-term current use of insulin [facility])   Primary Care at [place], [place]      Future Appointments            In 2 days Forrest Moron, MD Primary Care at [place], [place] - Patient is not pregnant      Passed - Last BP in normal range    BP Readings from Last 1 Encounters:  04/25/18 123/84

## 2018-12-18 ENCOUNTER — Encounter: Payer: Self-pay | Admitting: Family Medicine

## 2018-12-18 ENCOUNTER — Ambulatory Visit (INDEPENDENT_AMBULATORY_CARE_PROVIDER_SITE_OTHER): Payer: Commercial Managed Care - PPO | Admitting: Family Medicine

## 2018-12-18 ENCOUNTER — Other Ambulatory Visit: Payer: Self-pay

## 2018-12-18 VITALS — BP 123/79 | HR 94 | Temp 98.6°F | Ht 67.72 in | Wt 176.0 lb

## 2018-12-18 DIAGNOSIS — I10 Essential (primary) hypertension: Secondary | ICD-10-CM

## 2018-12-18 DIAGNOSIS — E1165 Type 2 diabetes mellitus with hyperglycemia: Secondary | ICD-10-CM | POA: Diagnosis not present

## 2018-12-18 DIAGNOSIS — E119 Type 2 diabetes mellitus without complications: Secondary | ICD-10-CM

## 2018-12-18 DIAGNOSIS — Z794 Long term (current) use of insulin: Secondary | ICD-10-CM | POA: Diagnosis not present

## 2018-12-18 LAB — POCT GLYCOSYLATED HEMOGLOBIN (HGB A1C): Hemoglobin A1C: 9.6 % — AB (ref 4.0–5.6)

## 2018-12-18 MED ORDER — XIGDUO XR 5-1000 MG PO TB24: 1.0000 | ORAL_TABLET | Freq: Two times a day (BID) | ORAL | 0 refills | Status: DC

## 2018-12-18 MED ORDER — BASAGLAR KWIKPEN 100 UNIT/ML ~~LOC~~ SOPN: PEN_INJECTOR | SUBCUTANEOUS | 1 refills | Status: DC

## 2018-12-18 NOTE — Progress Notes (Signed)
 Established Patient Office Visit  Subjective:  Patient ID: Jake Miller, male    DOB: 06/08/1970  Age: 48 y.o. MRN: [id number]  CC:  Chief Complaint  Patient presents with  . Diabetes    does not think the trulicity weekly is enough, feels very tired. Has solstar insulin left over, taking 50u nightly has more energy during the day. says he was taken off of the basaglar. Wants to talk about adjusting    HPI Jake Miller presents for   Uncontrolled diabetes with hyperglycemia, Insulin Dependent  He is taking the Trulicity 1.5mg weekly and Xigduo XR 5-1000mg BID  He is getting readings for his blood glucose that are consistently over 250s He reports that he has some left over insulin from the lantus and has been taking 50 units of lantus at night and when he wakes up his blood sugars are 140-150s He is out of his lantus  He reports that the trulicity is not enough  Lab Results  Component Value Date   HGBA1C 9.6 (A) 04/25/2018   No diarrhea No chest pains or palpitations No erectile dysfunction  No nausea or vomiting    Past Medical History:  Diagnosis Date  . Diabetes mellitus   . GERD (gastroesophageal reflux disease)   . Hypertension     History reviewed. No pertinent surgical history.  History reviewed. No pertinent family history.  Social History   Socioeconomic History  . Marital status: Married    Spouse name: Not on file  . Number of children: 3  . Years of education: Not on file  . Highest education level: Not on file  Occupational History  . Not on file  Social Needs  . Financial resource strain: Not on file  . Food insecurity    Worry: Not on file    Inability: Not on file  . Transportation needs    Medical: Not on file    Non-medical: Not on file  Tobacco Use  . Smoking status: Former Smoker  . Smokeless tobacco: Current User  Substance and Sexual Activity  . Alcohol use: No    Alcohol/week: 0.0 standard drinks  . Drug use: No  . Sexual  activity: Yes  Lifestyle  . Physical activity    Days per week: Not on file    Minutes per session: Not on file  . Stress: Not on file  Relationships  . Social connections    Talks on phone: Not on file    Gets together: Not on file    Attends religious service: Not on file    Active member of club or organization: Not on file    Attends meetings of clubs or organizations: Not on file    Relationship status: Not on file  . Intimate partner violence    Fear of current or ex partner: Not on file    Emotionally abused: Not on file    Physically abused: Not on file    Forced sexual activity: Not on file  Other Topics Concern  . Not on file  Social History Narrative  . Not on file    Outpatient Medications Prior to Visit  Medication Sig Dispense Refill  . atorvastatin (LIPITOR) 20 MG tablet Take 1 tablet (20 mg total) by mouth daily. 90 tablet 3  . blood glucose meter kit and supplies KIT Check blood sugar in morning before eating breakfast, and one hour after a meal. Dx codes: E11.9, Z79.4. 1 each 0  . cyclobenzaprine (FLEXERIL) 5 MG   tablet Take 2 tablets (10 mg total) by mouth at bedtime. 90 tablet 1  . famotidine (PEPCID) 20 MG tablet Take 1 tablet (20 mg total) by mouth 2 (two) times daily before a meal. 60 tablet 11  . glucose blood (ONE TOUCH ULTRA TEST) test strip CHECK BLOOD SUGAR IN MORNING BEFORE EATING BREAKFAST AND 1 HOUR AFTER A MEAL 100 each 3  . lisinopril (ZESTRIL) 10 MG tablet TAKE 1 TABLET BY MOUTH EVERY DAY 30 tablet 0  . ONE TOUCH LANCETS MISC Check blood sugar in morning before eating breakfast, and one hour after a meal. Dx codes: E11.9, Z79.4. 200 each 3  . XIGDUO XR 07-998 MG TB24 TAKE 1 TABLET BY MOUTH 2 (TWO) TIMES DAILY WITH A MEAL. 60 tablet 0  . Dulaglutide (TRULICITY) 1.5 MG/0.5ML SOPN Inject 1.5mg subcutaneously weekly. NEEDS OFFICE VISIT FOR REFILLS. 3 pen 0  . Insulin Glargine (BASAGLAR KWIKPEN) 100 UNIT/ML SOPN PLEASE SPECIFY DIRECTIONS, REFILLS AND  QUANTITY (Patient not taking: Reported on 12/18/2018) 3 pen 1   No facility-administered medications prior to visit.     No Known Allergies  ROS Review of Systems Review of Systems  Constitutional: Negative for activity change, appetite change, chills and fever.  HENT: Negative for congestion, nosebleeds, trouble swallowing and voice change.   Respiratory: Negative for cough, shortness of breath and wheezing.   Gastrointestinal: Negative for diarrhea, nausea and vomiting.  Genitourinary: Negative for difficulty urinating, dysuria, flank pain and hematuria.  Musculoskeletal: Negative for back pain, joint swelling and neck pain.  Neurological: Negative for dizziness, speech difficulty, light-headedness and numbness.  See HPI. All other review of systems negative.     Objective:    Physical Exam  BP 123/79   Pulse 94   Temp 98.6 F (37 C)   Ht 5' 7.72" (1.72 m)   Wt 176 lb (79.8 kg)   SpO2 97%   BMI 26.98 kg/m  Wt Readings from Last 3 Encounters:  12/18/18 176 lb (79.8 kg)  04/25/18 178 lb 3.2 oz (80.8 kg)  02/20/18 184 lb 3.2 oz (83.6 kg)   Physical Exam  Constitutional: Oriented to person, place, and time. Appears well-developed and well-nourished.  HENT:  Head: Normocephalic and atraumatic.  Eyes: Conjunctivae and EOM are normal.  Cardiovascular: Normal rate, regular rhythm, normal heart sounds and intact distal pulses.  No murmur heard. Pulmonary/Chest: Effort normal and breath sounds normal. No stridor. No respiratory distress. Has no wheezes.  Neurological: Is alert and oriented to person, place, and time.  Skin: Skin is warm. Capillary refill takes less than 2 seconds.  Psychiatric: Has a normal mood and affect. Behavior is normal. Judgment and thought content normal.    Health Maintenance Due  Topic Date Due  . OPHTHALMOLOGY EXAM  05/23/2016  . HEMOGLOBIN A1C  10/24/2018    There are no preventive care reminders to display for this patient.  Lab Results   Component Value Date   TSH 1.920 04/19/2017   Lab Results  Component Value Date   WBC 7.2 04/25/2018   HGB 16.5 04/25/2018   HCT 45.8 04/25/2018   MCV 83 04/25/2018   PLT 266 04/25/2018   Lab Results  Component Value Date   NA 138 04/25/2018   K 4.7 04/25/2018   CO2 22 04/25/2018   GLUCOSE 228 (H) 04/25/2018   BUN 11 04/25/2018   CREATININE 1.03 04/25/2018   BILITOT 0.6 04/25/2018   ALKPHOS 61 04/25/2018   AST 15 04/25/2018   ALT 15 04/25/2018     PROT 7.4 04/25/2018   ALBUMIN 4.5 04/25/2018   CALCIUM 9.9 04/25/2018   Lab Results  Component Value Date   CHOL 184 02/20/2018   Lab Results  Component Value Date   HDL 30 (L) 02/20/2018   Lab Results  Component Value Date   LDLCALC 113 (H) 02/20/2018   Lab Results  Component Value Date   TRIG 206 (H) 02/20/2018   Lab Results  Component Value Date   CHOLHDL 6.1 (H) 02/20/2018   Lab Results  Component Value Date   HGBA1C 9.6 (A) 04/25/2018      Assessment & Plan:   Problem List Items Addressed This Visit      Cardiovascular and Mediastinum   Essential hypertension   Relevant Orders   CMP14+EGFR   Lipid panel    Other Visit Diagnoses    Diabetes mellitus type 2, insulin dependent (HCC)    -  Primary   Relevant Orders   POCT glycosylated hemoglobin (Hb A1C)   CMP14+EGFR   Lipid panel      No orders of the defined types were placed in this encounter.   Follow-up: No follow-ups on file.    Zoe A Stallings, MD 

## 2018-12-18 NOTE — Patient Instructions (Signed)
° ° ° °  If you have lab work done today you will be contacted with your lab results within the next 2 weeks.  If you have not heard from us then please contact us. The fastest way to get your results is to register for My Chart. ° ° °IF you received an x-ray today, you will receive an invoice from [facility]. Please contact [facility] at [phone] with questions or concerns regarding your invoice.  ° °IF you received labwork today, you will receive an invoice from [facility]. Please contact [facility] at [phone] with questions or concerns regarding your invoice.  ° °Our billing staff will not be able to assist you with questions regarding bills from these companies. ° °You will be contacted with the lab results as soon as they are available. The fastest way to get your results is to activate your My Chart account. Instructions are located on the last page of this paperwork. If you have not heard from us regarding the results in 2 weeks, please contact this office. °  ° ° ° °

## 2018-12-19 LAB — CMP14+EGFR
ALT: 17 IU/L (ref 0–44)
AST: 17 IU/L (ref 0–40)
Albumin/Globulin Ratio: 1.7 (ref 1.2–2.2)
Albumin: 4.5 g/dL (ref 4.0–5.0)
Alkaline Phosphatase: 59 IU/L (ref 39–117)
BUN/Creatinine Ratio: 16 (ref 9–20)
BUN: 16 mg/dL (ref 6–24)
Bilirubin Total: 0.5 mg/dL (ref 0.0–1.2)
CO2: 19 mmol/L — ABNORMAL LOW (ref 20–29)
Calcium: 9.6 mg/dL (ref 8.7–10.2)
Chloride: 100 mmol/L (ref 96–106)
Creatinine, Ser: 1 mg/dL (ref 0.76–1.27)
GFR calc Af Amer: 103 mL/min/{1.73_m2} (ref >59)
GFR calc non Af Amer: 89 mL/min/{1.73_m2} (ref >59)
Globulin, Total: 2.6 g/dL (ref 1.5–4.5)
Glucose: 274 mg/dL — ABNORMAL HIGH (ref 65–99)
Potassium: 4.3 mmol/L (ref 3.5–5.2)
Sodium: 136 mmol/L (ref 134–144)
Total Protein: 7.1 g/dL (ref 6.0–8.5)

## 2018-12-19 LAB — LIPID PANEL
Chol/HDL Ratio: 4.7 ratio (ref 0.0–5.0)
Cholesterol, Total: 160 mg/dL (ref 100–199)
HDL: 34 mg/dL — ABNORMAL LOW (ref >39)
LDL Chol Calc (NIH): 94 mg/dL (ref 0–99)
Triglycerides: 183 mg/dL — ABNORMAL HIGH (ref 0–149)
VLDL Cholesterol Cal: 32 mg/dL (ref 5–40)

## 2018-12-20 ENCOUNTER — Encounter: Payer: Self-pay | Admitting: Radiology

## 2018-12-25 ENCOUNTER — Ambulatory Visit: Payer: Commercial Managed Care - PPO | Admitting: Family Medicine

## 2019-01-10 ENCOUNTER — Other Ambulatory Visit: Payer: Self-pay | Admitting: Family Medicine

## 2019-01-10 NOTE — Telephone Encounter (Signed)
Forwarding medication refill request to the clinical pool for review. 

## 2019-01-11 ENCOUNTER — Other Ambulatory Visit: Payer: Self-pay | Admitting: Family Medicine

## 2019-01-11 DIAGNOSIS — I10 Essential (primary) hypertension: Secondary | ICD-10-CM

## 2019-01-11 NOTE — Telephone Encounter (Signed)
Forwarding medication refill request to the clinical pool for review. 

## 2019-01-12 ENCOUNTER — Other Ambulatory Visit: Payer: Self-pay | Admitting: Family Medicine

## 2019-01-12 NOTE — Telephone Encounter (Signed)
Forwarding medication review request to the clinical pool for review. 

## 2019-01-14 ENCOUNTER — Telehealth: Payer: Self-pay | Admitting: *Deleted

## 2019-01-14 NOTE — Telephone Encounter (Signed)
Sent to scheduling

## 2019-02-18 ENCOUNTER — Other Ambulatory Visit: Payer: Self-pay | Admitting: Family Medicine

## 2019-02-18 NOTE — Telephone Encounter (Signed)
Need an appt before any further refills

## 2019-02-18 NOTE — Telephone Encounter (Signed)
Requested medication (s) are due for refill today: yes  Requested medication (s) are on the active medication list: yes  Last refill:  01/21/2019  Future visit scheduled: no  Notes to clinic:  Patient due for follow up  Review for refill   Requested Prescriptions  Pending Prescriptions Disp Refills   TRULICITY 1.5 HB/7.1IR SOPN [Pharmacy Med Name: TRULICITY 1.5 CV/8.9 ML PEN]      Sig: INJECT 1.5MG  SUBCUTANEOUSLY WEEKLY. NEEDS OFFICE VISIT FOR REFILLS.      Endocrinology:  Diabetes - GLP-1 Receptor Agonists Failed - 02/18/2019  2:11 AM      Failed - HBA1C is between 0 and 7.9 and within 180 days    Hemoglobin A1C  Date Value Ref Range Status  12/18/2018 9.6 (A) 4.0 - 5.6 % Final   Hgb A1c MFr Bld  Date Value Ref Range Status  04/19/2017 9.9 (H) 4.8 - 5.6 % Final    Comment:             Prediabetes: 5.7 - 6.4          Diabetes: >6.4          Glycemic control for adults with diabetes: <7.0           Passed - Valid encounter within last 6 months    Recent Outpatient Visits           2 months ago Uncontrolled type 2 diabetes mellitus with hyperglycemia ([place])   Primary Care at [facility], Zoe A, MD   9 months ago Elevated hemoglobin ([place])   Primary Care at [facility], Arlie Solomons, MD   12 months ago Essential hypertension   Primary Care at [facility] [place] A, MD   1 year ago Generalized abdominal pain   Primary Care at [place], [place]   2 years ago Uncontrolled type 2 diabetes mellitus without complication, with long-term current use of insulin [facility])   Primary Care at [place], PA-C                famotidine (PEPCID) 20 MG tablet [Pharmacy Med Name: FAMOTIDINE 20 MG TABLET] 180 tablet 3    Sig: TAKE 1 TABLET BY MOUTH TWICE A DAY BEFORE A MEAL      Gastroenterology:  H2 Antagonists Passed - 02/18/2019  2:11 AM      Passed - Valid encounter within last 12 months    Recent Outpatient Visits           2 months ago Uncontrolled type  2 diabetes mellitus with hyperglycemia ([place])   Primary Care at [place], MD   9 months ago Elevated hemoglobin ([place])   Primary Care at [place], MD   12 months ago Essential hypertension   Primary Care at [place], MD   1 year ago Generalized abdominal pain   Primary Care at [place], [place]   2 years ago Uncontrolled type 2 diabetes mellitus without complication, with long-term current use of insulin [facility])   Primary Care at [place], [place]

## 2019-02-24 NOTE — Telephone Encounter (Signed)
No refill without office visit 

## 2019-02-24 NOTE — Telephone Encounter (Signed)
Spoke with pt. Scheduled appt for 01/04 and pt requested temporary refill until appt date. Please advise

## 2019-02-24 NOTE — Telephone Encounter (Signed)
Pt requesting temporary refill till appt date if possible

## 2019-03-02 ENCOUNTER — Encounter (HOSPITAL_COMMUNITY): Payer: Self-pay

## 2019-03-02 ENCOUNTER — Other Ambulatory Visit: Payer: Self-pay

## 2019-03-02 ENCOUNTER — Ambulatory Visit (HOSPITAL_COMMUNITY)
Admission: EM | Admit: 2019-03-02 | Discharge: 2019-03-02 | Disposition: A | Discharge status: Home / Self Care | Payer: Commercial Managed Care - PPO | Attending: Family Medicine | Admitting: Family Medicine

## 2019-03-02 DIAGNOSIS — R369 Urethral discharge, unspecified: Secondary | ICD-10-CM | POA: Diagnosis present

## 2019-03-02 DIAGNOSIS — Z202 Contact with and (suspected) exposure to infections with a predominantly sexual mode of transmission: Secondary | ICD-10-CM

## 2019-03-02 DIAGNOSIS — I10 Essential (primary) hypertension: Secondary | ICD-10-CM

## 2019-03-02 DIAGNOSIS — R300 Dysuria: Secondary | ICD-10-CM | POA: Diagnosis not present

## 2019-03-02 MED ORDER — CEFTRIAXONE SODIUM 250 MG IJ SOLR
INTRAMUSCULAR | Status: AC
  Filled 2019-03-02: qty 250

## 2019-03-02 MED ORDER — AZITHROMYCIN 250 MG PO TABS
1000.0000 mg | ORAL_TABLET | Freq: Once | ORAL | Status: AC
  Administered 2019-03-02: 11:00:00 1000 mg via ORAL

## 2019-03-02 MED ORDER — AZITHROMYCIN 250 MG PO TABS
ORAL_TABLET | ORAL | Status: AC
  Filled 2019-03-02: qty 4

## 2019-03-02 MED ORDER — LIDOCAINE HCL 2 % IJ SOLN
INTRAMUSCULAR | Status: AC
  Filled 2019-03-02: qty 20

## 2019-03-02 MED ORDER — CEFTRIAXONE SODIUM 250 MG IJ SOLR
250.0000 mg | Freq: Once | INTRAMUSCULAR | Status: AC
  Administered 2019-03-02: 11:00:00 250 mg via INTRAMUSCULAR

## 2019-03-02 NOTE — ED Triage Notes (Signed)
Pt presents with penile discharge and pain with urination since yesterday.

## 2019-03-02 NOTE — Discharge Instructions (Signed)
Treating you today for gonorrhea and chlamydia based on symptoms We will call you with any positive results of your swab.

## 2019-03-02 NOTE — ED Provider Notes (Signed)
[facility]    CSN: [id number] Arrival date & time: 03/02/19  1010      History   Chief Complaint Chief Complaint  Patient presents with  . Penile Discharge    HPI Jake Miller is a 48 y.o. male.   Patient is a 42-year male past medical history of diabetes, GERD, hypertension.  He presents today with approximately 1 day of dysuria and penile discharge.  Symptoms have been constant.  Reporting sexual encounter approximately 3 to 4 days ago unprotected.  No hematuria, urinary frequency, testicle pain or swelling.  No rashes.  ROS per HPI      Past Medical History:  Diagnosis Date  . Diabetes mellitus   . GERD (gastroesophageal reflux disease)   . Hypertension     Patient Active Problem List   Diagnosis Date Noted  . Type 2 diabetes mellitus with hyperlipidemia ([place]) 2018/03/01  . H. pylori infection March 01, 2018  . Uncontrolled type 2 diabetes mellitus without complication, with long-term current use of insulin 02/26/2016  . Left ankle injury 12/24/2014  . Essential hypertension 04/01/2014  . GERD (gastroesophageal reflux disease) 01/06/2013    History reviewed. No pertinent surgical history.     Home Medications    Prior to Admission medications   Medication Sig Start Date End Date Taking? Authorizing Provider  atorvastatin (LIPITOR) 20 MG tablet Take 1 tablet (20 mg total) by mouth daily. 04/25/18   Forrest Moron, MD  blood glucose meter kit and supplies KIT Check blood sugar in morning before eating breakfast, and one hour after a meal. Dx codes: E11.9, Z79.4. August 29, 2014   [place], Tishira R, PA-C  cyclobenzaprine (FLEXERIL) 5 MG tablet Take 2 tablets (10 mg total) by mouth at bedtime. 04/25/18   Forrest Moron, MD  Dapagliflozin-metFORMIN HCl ER (XIGDUO XR) 07-998 MG TB24 Take 1 tablet by mouth 2 (two) times daily with a meal. E11.65 12/18/18   Forrest Moron, MD  famotidine (PEPCID) 20 MG tablet Take 1 tablet (20 mg total) by mouth 2 (two)  times daily before a meal. 2018/03/01   Stallings, Zoe A, MD  glucose blood (ONE TOUCH ULTRA TEST) test strip CHECK BLOOD SUGAR IN MORNING BEFORE EATING BREAKFAST AND 1 HOUR AFTER A MEAL 04/25/18   Delia Chimes A, MD  Insulin Glargine (BASAGLAR KWIKPEN) 100 UNIT/ML SOPN Inject 40 units at bedtime daily for goal fasting glucose of 140 or less E11.65 12/18/18   Stallings, Zoe A, MD  lisinopril (ZESTRIL) 10 MG tablet TAKE 1 TABLET BY MOUTH EVERY DAY 01/14/19   Forrest Moron, MD  ONE TOUCH LANCETS MISC Check blood sugar in morning before eating breakfast, and one hour after a meal. Dx codes: E11.9, Z79.4. 03/01/2018   Forrest Moron, MD  TRULICITY 1.5 IW/5.8KD SOPN INJECT 1.5MG SUBCUTANEOUSLY WEEKLY. NEEDS OFFICE VISIT FOR REFILLS. 01/21/19   Forrest Moron, MD    Family History History reviewed. No pertinent family history.  Social History Social History   Tobacco Use  . Smoking status: Former Research scientist (life sciences)  . Smokeless tobacco: Current User  Substance Use Topics  . Alcohol use: No    Alcohol/week: 0.0 standard drinks  . Drug use: No     Allergies   Patient has no known allergies.   Review of Systems Review of Systems   Physical Exam Triage Vital Signs ED Triage Vitals  Enc Vitals Group     BP 03/02/19 1028 (!) 175/82     Pulse Rate 03/02/19 1028 89  Resp 03/02/19 1028 18     Temp 03/02/19 1028 98.1 F (36.7 C)     Temp Source 03/02/19 1028 Oral     SpO2 03/02/19 1028 94 %     Weight --      Height --      Head Circumference --      Peak Flow --      Pain Score 03/02/19 1030 5     Pain Loc --      Pain Edu? --      Excl. in [place]? --    No data found.  Updated Vital Signs BP (!) 175/82 (BP Location: Left Arm)   Pulse 89   Temp 98.1 F (36.7 C) (Oral)   Resp 18   SpO2 94%   Visual Acuity Right Eye Distance:   Left Eye Distance:   Bilateral Distance:    Right Eye Near:   Left Eye Near:    Bilateral Near:     Physical Exam Vitals and nursing note  reviewed.  Constitutional:      Appearance: Normal appearance.  HENT:     Head: Normocephalic and atraumatic.     Nose: Nose normal.  Eyes:     Conjunctiva/sclera: Conjunctivae normal.  Pulmonary:     Effort: Pulmonary effort is normal.  Musculoskeletal:        General: Normal range of motion.     Cervical back: Normal range of motion.  Skin:    General: Skin is warm and dry.  Neurological:     Mental Status: He is alert.  Psychiatric:        Mood and Affect: Mood normal.      UC Treatments / Results  Labs (all labs ordered are listed, but only abnormal results are displayed) Labs Reviewed  CYTOLOGY, (ORAL, ANAL, URETHRAL) ANCILLARY ONLY    EKG   Radiology No results found.  Procedures Procedures (including critical care time)  Medications Ordered in UC Medications  cefTRIAXone (ROCEPHIN) injection 250 mg (has no administration in time range)  azithromycin (ZITHROMAX) tablet 1,000 mg (has no administration in time range)    Initial Impression / Assessment and Plan / UC Course  I have reviewed the triage vital signs and the nursing notes.  Pertinent labs & imaging results that were available during my care of the patient were reviewed by me and considered in my medical decision making (see chart for details).     Penile discharge-swab sent for testing Treated prophylactically today for gonorrhea and chlamydia based on symptoms Final Clinical Impressions(s) / UC Diagnoses   Final diagnoses:  Penile discharge     Discharge Instructions     Treating you today for gonorrhea and chlamydia based on symptoms We will call you with any positive results of your swab.    ED Prescriptions    None     PDMP not reviewed this encounter.   Jake July, NP 03/02/19 1054

## 2019-03-04 LAB — CYTOLOGY, (ORAL, ANAL, URETHRAL) ANCILLARY ONLY
Chlamydia: NEGATIVE
Neisseria Gonorrhea: NEGATIVE
Trichomonas: NEGATIVE

## 2019-03-10 ENCOUNTER — Ambulatory Visit (INDEPENDENT_AMBULATORY_CARE_PROVIDER_SITE_OTHER): Payer: Commercial Managed Care - PPO | Admitting: Family Medicine

## 2019-03-10 ENCOUNTER — Other Ambulatory Visit: Payer: Self-pay

## 2019-03-10 ENCOUNTER — Encounter: Payer: Self-pay | Admitting: Family Medicine

## 2019-03-10 VITALS — BP 127/81 | HR 85 | Temp 98.2°F | Resp 17 | Ht 67.72 in | Wt 182.6 lb

## 2019-03-10 DIAGNOSIS — E1165 Type 2 diabetes mellitus with hyperglycemia: Secondary | ICD-10-CM | POA: Diagnosis not present

## 2019-03-10 DIAGNOSIS — Z794 Long term (current) use of insulin: Secondary | ICD-10-CM

## 2019-03-10 DIAGNOSIS — E119 Type 2 diabetes mellitus without complications: Secondary | ICD-10-CM | POA: Diagnosis not present

## 2019-03-10 LAB — POCT GLYCOSYLATED HEMOGLOBIN (HGB A1C): Hemoglobin A1C: 7.2 % — AB (ref 4.0–5.6)

## 2019-03-10 LAB — GLUCOSE, POCT (MANUAL RESULT ENTRY): POC Glucose: 7.2 mg/dl — AB (ref 70–99)

## 2019-03-10 NOTE — Patient Instructions (Signed)
° ° ° °  If you have lab work done today you will be contacted with your lab results within the next 2 weeks.  If you have not heard from us then please contact us. The fastest way to get your results is to register for My Chart. ° ° °IF you received an x-ray today, you will receive an invoice from [facility]. Please contact [facility] at [phone] with questions or concerns regarding your invoice.  ° °IF you received labwork today, you will receive an invoice from [facility]. Please contact [facility] at [phone] with questions or concerns regarding your invoice.  ° °Our billing staff will not be able to assist you with questions regarding bills from these companies. ° °You will be contacted with the lab results as soon as they are available. The fastest way to get your results is to activate your My Chart account. Instructions are located on the last page of this paperwork. If you have not heard from us regarding the results in 2 weeks, please contact this office. °  ° ° ° °

## 2019-03-10 NOTE — Progress Notes (Signed)
Established Patient Office Visit  Subjective:  Patient ID: Jake Miller, male    DOB: 1970-11-18  Age: 49 y.o. MRN: [id number]  CC:  Chief Complaint  Patient presents with  . Medication Refill    xigduo xr, trulicity, test strips, basaglar kwikpen and lancets    HPI Jake Miller presents for   Diabetes Mellitus: Patient reports that he has some hypoglycemia with sugars around 95 a week ago.  The past 2-3 months it has been 120-140s fasting. He is taking xiduo XR, he is on Trulicity 0.1BP subcutaneously weekly, he is also on Glargine 50 units at bedtime  He is also on lisinopril 67m for renal protection He is taking Atrovastatin 273mHe is taking asa [id number] Lab Results  Component Value Date   HGBA1C 7.2 (A) 03/10/2019     Past Medical History:  Diagnosis Date  . Diabetes mellitus   . GERD (gastroesophageal reflux disease)   . Hypertension     No past surgical history on file.  No family history on file.  Social History   Socioeconomic History  . Marital status: Married    Spouse name: Not on file  . Number of children: 3  . Years of education: Not on file  . Highest education level: Not on file  Occupational History  . Not on file  Tobacco Use  . Smoking status: Former SmoResearch scientist (life sciences) Smokeless tobacco: Current User  Substance and Sexual Activity  . Alcohol use: No    Alcohol/week: 0.0 standard drinks  . Drug use: No  . Sexual activity: Yes  Other Topics Concern  . Not on file  Social History Narrative  . Not on file   Social Determinants of Health   Financial Resource Strain:   . Difficulty of Paying Living Expenses: Not on file  Food Insecurity:   . Worried About RunCharity fundraiser the Last Year: Not on file  . Ran Out of Food in the Last Year: Not on file  Transportation Needs:   . Lack of Transportation (Medical): Not on file  . Lack of Transportation (Non-Medical): Not on file  Physical Activity:   . Days of Exercise per Week: Not on file   . Minutes of Exercise per Session: Not on file  Stress:   . Feeling of Stress : Not on file  Social Connections:   . Frequency of Communication with Friends and Family: Not on file  . Frequency of Social Gatherings with Friends and Family: Not on file  . Attends Religious Services: Not on file  . Active Member of Clubs or Organizations: Not on file  . Attends CluArchivistetings: Not on file  . Marital Status: Not on file  Intimate Partner Violence:   . Fear of Current or Ex-Partner: Not on file  . Emotionally Abused: Not on file  . Physically Abused: Not on file  . Sexually Abused: Not on file    Outpatient Medications Prior to Visit  Medication Sig Dispense Refill  . aspirin EC 81 MG tablet Take 81 mg by mouth daily.    . aMarland Kitchenorvastatin (LIPITOR) 20 MG tablet Take 1 tablet (20 mg total) by mouth daily. 90 tablet 3  . blood glucose meter kit and supplies KIT Check blood sugar in morning before eating breakfast, and one hour after a meal. Dx codes: E11.9, Z79.4. 1 each 0  . cyclobenzaprine (FLEXERIL) 5 MG tablet Take 2 tablets (10 mg total) by mouth at bedtime. 90 tablet  1  . Dapagliflozin-metFORMIN HCl ER (XIGDUO XR) 07-998 MG TB24 Take 1 tablet by mouth 2 (two) times daily with a meal. E11.65 180 tablet 0  . famotidine (PEPCID) 20 MG tablet Take 1 tablet (20 mg total) by mouth 2 (two) times daily before a meal. 60 tablet 11  . glucose blood (ONE TOUCH ULTRA TEST) test strip CHECK BLOOD SUGAR IN MORNING BEFORE EATING BREAKFAST AND 1 HOUR AFTER A MEAL 100 each 3  . Insulin Glargine (BASAGLAR KWIKPEN) 100 UNIT/ML SOPN Inject 40 units at bedtime daily for goal fasting glucose of 140 or less E11.65 10 pen 1  . lisinopril (ZESTRIL) 10 MG tablet TAKE 1 TABLET BY MOUTH EVERY DAY 90 tablet 0  . ONE TOUCH LANCETS MISC Check blood sugar in morning before eating breakfast, and one hour after a meal. Dx codes: E11.9, Z79.4. 200 each 3  . TRULICITY 1.5 WI/2.0BT SOPN INJECT 1.5MG  SUBCUTANEOUSLY WEEKLY. NEEDS OFFICE VISIT FOR REFILLS. 2 pen 0   No facility-administered medications prior to visit.    No Known Allergies  ROS Review of Systems Review of Systems  Constitutional: Negative for activity change, appetite change, chills and fever.  HENT: Negative for congestion, nosebleeds, trouble swallowing and voice change.   Respiratory: Negative for cough, shortness of breath and wheezing.   Gastrointestinal: Negative for diarrhea, nausea and vomiting.  Genitourinary: Negative for difficulty urinating, dysuria, flank pain and hematuria.  Musculoskeletal: Negative for back pain, joint swelling and neck pain.  Neurological: Negative for dizziness, speech difficulty, light-headedness and numbness.  See HPI. All other review of systems negative.     Objective:    Physical Exam  BP 127/81 (BP Location: Right Arm, Patient Position: Sitting, Cuff Size: Normal)   Pulse 85   Temp 98.2 F (36.8 C) (Oral)   Resp 17   Ht 5' 7.72" (1.72 m)   Wt 182 lb 9.6 oz (82.8 kg)   SpO2 98%   BMI 27.99 kg/m  Wt Readings from Last 3 Encounters:  03/10/19 182 lb 9.6 oz (82.8 kg)  12/18/18 176 lb (79.8 kg)  04/25/18 178 lb 3.2 oz (80.8 kg)   Physical Exam  Constitutional: Oriented to person, place, and time. Appears well-developed and well-nourished.  HENT:  Head: Normocephalic and atraumatic.  Eyes: Conjunctivae and EOM are normal.  Cardiovascular: Normal rate, regular rhythm, normal heart sounds and intact distal pulses.  No murmur heard. Pulmonary/Chest: Effort normal and breath sounds normal. No stridor. No respiratory distress. Has no wheezes.  Neurological: Is alert and oriented to person, place, and time.  Skin: Skin is warm. Capillary refill takes less than 2 seconds.  Psychiatric: Has a normal mood and affect. Behavior is normal. Judgment and thought content normal.    Health Maintenance Due  Topic Date Due  . OPHTHALMOLOGY EXAM  05/23/2016  . FOOT EXAM   02/21/2019    There are no preventive care reminders to display for this patient.  Lab Results  Component Value Date   TSH 1.920 04/19/2017   Lab Results  Component Value Date   WBC 7.2 04/25/2018   HGB 16.5 04/25/2018   HCT 45.8 04/25/2018   MCV 83 04/25/2018   PLT 266 04/25/2018   Lab Results  Component Value Date   NA 136 12/18/2018   K 4.3 12/18/2018   CO2 19 (L) 12/18/2018   GLUCOSE 274 (H) 12/18/2018   BUN 16 12/18/2018   CREATININE 1.00 12/18/2018   BILITOT 0.5 12/18/2018   ALKPHOS 59 12/18/2018  AST 17 12/18/2018   ALT 17 12/18/2018   PROT 7.1 12/18/2018   ALBUMIN 4.5 12/18/2018   CALCIUM 9.6 12/18/2018   Lab Results  Component Value Date   CHOL 160 12/18/2018   Lab Results  Component Value Date   HDL 34 (L) 12/18/2018   Lab Results  Component Value Date   LDLCALC 94 12/18/2018   Lab Results  Component Value Date   TRIG 183 (H) 12/18/2018   Lab Results  Component Value Date   CHOLHDL 4.7 12/18/2018   Lab Results  Component Value Date   HGBA1C 7.2 (A) 03/10/2019      Assessment & Plan:   Problem List Items Addressed This Visit    Diabetes mellitus type 2, insulin dependent ([place])    -  Primary Diabetes improved on current meds Discussed decreasing insulin by 2 units at a time To prevent hypoglycemia   Relevant Medications   aspirin EC 81 MG tablet   Type 2 diabetes mellitus with hyperglycemia, with long-term current use of insulin ([facility])       Relevant Medications   aspirin EC 81 MG tablet   Other Relevant Orders   POCT glycosylated hemoglobin (Hb A1C) (Completed)   POCT glucose (manual entry) (Completed)      No orders of the defined types were placed in this encounter.   Follow-up: No follow-ups on file.    Forrest Moron, MD

## 2019-04-04 ENCOUNTER — Other Ambulatory Visit: Payer: Self-pay | Admitting: Family Medicine

## 2019-04-04 DIAGNOSIS — Z794 Long term (current) use of insulin: Secondary | ICD-10-CM

## 2019-04-04 DIAGNOSIS — E1165 Type 2 diabetes mellitus with hyperglycemia: Secondary | ICD-10-CM

## 2019-04-04 NOTE — Telephone Encounter (Signed)
Forwarding medication refill request to the clinical pool for review. 

## 2019-04-18 ENCOUNTER — Other Ambulatory Visit: Payer: Self-pay | Admitting: Family Medicine

## 2019-04-18 DIAGNOSIS — I10 Essential (primary) hypertension: Secondary | ICD-10-CM

## 2019-04-24 ENCOUNTER — Other Ambulatory Visit: Payer: Self-pay | Admitting: Family Medicine

## 2019-05-08 ENCOUNTER — Ambulatory Visit: Payer: Commercial Managed Care - PPO | Admitting: Family Medicine

## 2019-06-09 ENCOUNTER — Ambulatory Visit (INDEPENDENT_AMBULATORY_CARE_PROVIDER_SITE_OTHER): Payer: Commercial Managed Care - PPO

## 2019-06-09 ENCOUNTER — Encounter: Payer: Self-pay | Admitting: Family Medicine

## 2019-06-09 ENCOUNTER — Ambulatory Visit: Payer: Commercial Managed Care - PPO | Admitting: Family Medicine

## 2019-06-09 ENCOUNTER — Other Ambulatory Visit: Payer: Self-pay

## 2019-06-09 VITALS — BP 122/72 | HR 84 | Temp 98.6°F | Ht 67.72 in | Wt 184.0 lb

## 2019-06-09 DIAGNOSIS — M5431 Sciatica, right side: Secondary | ICD-10-CM | POA: Diagnosis not present

## 2019-06-09 DIAGNOSIS — E1165 Type 2 diabetes mellitus with hyperglycemia: Secondary | ICD-10-CM

## 2019-06-09 DIAGNOSIS — Z794 Long term (current) use of insulin: Secondary | ICD-10-CM

## 2019-06-09 DIAGNOSIS — I10 Essential (primary) hypertension: Secondary | ICD-10-CM

## 2019-06-09 LAB — POCT GLYCOSYLATED HEMOGLOBIN (HGB A1C): Hemoglobin A1C: 9.6 % — AB (ref 4.0–5.6)

## 2019-06-09 MED ORDER — TRULICITY 1.5 MG/0.5ML ~~LOC~~ SOAJ: 1.0000 | SUBCUTANEOUS | 1 refills | Status: DC

## 2019-06-09 MED ORDER — GABAPENTIN 100 MG PO CAPS: 100.0000 mg | ORAL_CAPSULE | Freq: Two times a day (BID) | ORAL | 3 refills | Status: AC

## 2019-06-09 NOTE — Progress Notes (Signed)
Established Patient Office Visit  Subjective:  Patient ID: Jake Miller, male    DOB: 04-24-1970  Age: 49 y.o. MRN: [id number]  CC:  Chief Complaint  Patient presents with  . Diabetes    medications Q' trulicity/ eye surgery next week retinal   . R leg pain    R hip pain     HPI Jake Miller presents for   Diabetes Mellitus: Patient presents for follow up of diabetes. Symptoms: hyperglycemia and visual disturbances. Symptoms have gradually worsened. Patient denies hypoglycemia  and weight loss.  Evaluation to date has been included: hemoglobin A1C.  Home sugars: BGs are high around dinner.  Lab Results  Component Value Date   HGBA1C 9.6 (A) 06/09/2019   Lab Results  Component Value Date   HGBA1C 9.6 (A) 06/09/2019   He has eye surgery on the right eye that will occur 06/19/2019 He states that states that the pharmacist   Hypertension: Patient here for follow-up of elevated blood pressure. He is exercising and is adherent to low salt diet.  Blood pressure is well controlled at home. Cardiac symptoms none. Patient denies chest pressure/discomfort, claudication, exertional chest pressure/discomfort and fatigue.  Cardiovascular risk factors: diabetes mellitus, hypertension and male gender. Use of agents associated with hypertension: none. History of target organ damage: none.  BP Readings from Last 3 Encounters:  06/09/19 122/72  03/10/19 127/81  03/02/19 (!) 175/82      He reports pain in his right back and right hip knee and right ankle pain  He states that it is intermittent over the past but lately has been persistent.  He denies falls or back injury.  Past Medical History:  Diagnosis Date  . Diabetes mellitus   . GERD (gastroesophageal reflux disease)   . Hypertension     No past surgical history on file.  No family history on file.  Social History   Socioeconomic History  . Marital status: Married    Spouse name: Not on file  . Number of children: 3  .  Years of education: Not on file  . Highest education level: Not on file  Occupational History  . Not on file  Tobacco Use  . Smoking status: Former Research scientist (life sciences)  . Smokeless tobacco: Current User  Substance and Sexual Activity  . Alcohol use: No    Alcohol/week: 0.0 standard drinks  . Drug use: No  . Sexual activity: Yes  Other Topics Concern  . Not on file  Social History Narrative  . Not on file   Social Determinants of Health   Financial Resource Strain:   . Difficulty of Paying Living Expenses:   Food Insecurity:   . Worried About Charity fundraiser in the Last Year:   . Arboriculturist in the Last Year:   Transportation Needs:   . Film/video editor (Medical):   Marland Kitchen Lack of Transportation (Non-Medical):   Physical Activity:   . Days of Exercise per Week:   . Minutes of Exercise per Session:   Stress:   . Feeling of Stress :   Social Connections:   . Frequency of Communication with Friends and Family:   . Frequency of Social Gatherings with Friends and Family:   . Attends Religious Services:   . Active Member of Clubs or Organizations:   . Attends Archivist Meetings:   Marland Kitchen Marital Status:   Intimate Partner Violence:   . Fear of Current or Ex-Partner:   . Emotionally Abused:   .  Physically Abused:   . Sexually Abused:     Outpatient Medications Prior to Visit  Medication Sig Dispense Refill  . aspirin EC 81 MG tablet Take 81 mg by mouth daily.    Marland Kitchen atorvastatin (LIPITOR) 20 MG tablet Take 1 tablet (20 mg total) by mouth daily. 90 tablet 3  . blood glucose meter kit and supplies KIT Check blood sugar in morning before eating breakfast, and one hour after a meal. Dx codes: E11.9, Z79.4. 1 each 0  . cyclobenzaprine (FLEXERIL) 5 MG tablet Take 2 tablets (10 mg total) by mouth at bedtime. 90 tablet 1  . famotidine (PEPCID) 20 MG tablet Take 1 tablet (20 mg total) by mouth 2 (two) times daily before a meal. 60 tablet 11  . glucose blood (ONE TOUCH ULTRA TEST)  test strip CHECK BLOOD SUGAR IN MORNING BEFORE EATING BREAKFAST AND 1 HOUR AFTER A MEAL 100 each 3  . Insulin Glargine (BASAGLAR KWIKPEN) 100 UNIT/ML SOPN INJECT 40 UNITS AT BEDTIME DAILY FOR GOAL FASTING GLUCOSE OF 140 OR LESS E11.65 10 pen 1  . ONE TOUCH LANCETS MISC Check blood sugar in morning before eating breakfast, and one hour after a meal. Dx codes: E11.9, Z79.4. 200 each 3  . XIGDUO XR 07-998 MG TB24 TAKE 1 TABLET BY MOUTH 2 (TWO) TIMES DAILY WITH A MEAL. E11.65 180 tablet 0  . lisinopril (ZESTRIL) 10 MG tablet TAKE 1 TABLET BY MOUTH EVERY DAY (Patient not taking: Reported on 06/09/2019) 90 tablet 1  . TRULICITY 1.5 FU/9.3AT SOPN INJECT 1.5MG SUBCUTANEOUSLY WEEKLY. NEEDS OFFICE VISIT FOR REFILLS. (Patient not taking: Reported on 06/09/2019) 2 pen 0   No facility-administered medications prior to visit.    No Known Allergies  ROS Review of Systems Review of Systems  Constitutional: Negative for activity change, appetite change, chills and fever.  HENT: Negative for congestion, nosebleeds, trouble swallowing and voice change.   Respiratory: Negative for cough, shortness of breath and wheezing.   Gastrointestinal: Negative for diarrhea, nausea and vomiting.  Genitourinary: Negative for difficulty urinating, dysuria, flank pain and hematuria.  Musculoskeletal: Negative for back pain, joint swelling and neck pain.  Neurological: Negative for dizziness, speech difficulty, light-headedness and numbness.  See HPI. All other review of systems negative.     Objective:    Physical Exam  BP 122/72   Pulse 84   Temp 98.6 F (37 C)   Ht 5' 7.72" (1.72 m)   Wt 184 lb (83.5 kg)   SpO2 97%   BMI 28.21 kg/m  Wt Readings from Last 3 Encounters:  06/09/19 184 lb (83.5 kg)  03/10/19 182 lb 9.6 oz (82.8 kg)  12/18/18 176 lb (79.8 kg)   Physical Exam  Constitutional: Oriented to person, place, and time. Appears well-developed and well-nourished.  HENT:  Head: Normocephalic and  atraumatic.  Eyes: Conjunctivae and EOM are normal.  Cardiovascular: Normal rate, regular rhythm, normal heart sounds and intact distal pulses.  No murmur heard. Pulmonary/Chest: Effort normal and breath sounds normal. No stridor. No respiratory distress. Has no wheezes.  Neurological: Is alert and oriented to person, place, and time.  Skin: Skin is warm. Capillary refill takes less than 2 seconds.  Psychiatric: Has a normal mood and affect. Behavior is normal. Judgment and thought content normal.   Lumbar Radiculopathy Exam Back exam: full range of motion, no tenderness, palpable spasm or pain on motion. Straight-leg raise: Positive bilaterally Reflexes:       Right leg: 2+ at knees bilaterally  Left leg: 2+ at knees bilaterally Strength: normal and equal bilaterally  Sensory exam: normal in both lower extremities.  Able to toe walk, heel walk without difficulty or obvious weakness. No obvious pain with hip motion or log rolling of leg.   COMPARISON:  None  FINDINGS: Five non-rib-bearing lumbar vertebra.  Dextroconvex thoracolumbar scoliosis apex L2.  BILATERAL spondylolysis L5 with minimal grade 1 spondylolisthesis.  Disc space narrowing L4-L5.  Vertebral body heights maintained without fracture or additional subluxation.  SI joints unremarkable.  IMPRESSION: Dextroconvex thoracolumbar scoliosis with mild degenerative disc disease changes of L4-L5.  BILATERAL spondylolysis L5 with minimal grade 1 spondylolisthesis L5-S1.   Electronically Signed   By: Lavonia Dana M.D.   On: 06/10/2019 08:31  Health Maintenance Due  Topic Date Due  . OPHTHALMOLOGY EXAM  02/07/2019    There are no preventive care reminders to display for this patient.  Lab Results  Component Value Date   TSH 1.920 04/19/2017   Lab Results  Component Value Date   WBC 7.2 04/25/2018   HGB 16.5 04/25/2018   HCT 45.8 04/25/2018   MCV 83 04/25/2018   PLT 266 04/25/2018   Lab  Results  Component Value Date   NA 139 06/09/2019   K 5.1 06/09/2019   CO2 22 06/09/2019   GLUCOSE 425 (H) 06/09/2019   BUN 21 06/09/2019   CREATININE 0.90 06/09/2019   BILITOT 0.3 06/09/2019   ALKPHOS 65 06/09/2019   AST 17 06/09/2019   ALT 22 06/09/2019   PROT 7.2 06/09/2019   ALBUMIN 4.1 06/09/2019   CALCIUM 9.2 06/09/2019   Lab Results  Component Value Date   CHOL 192 06/09/2019   Lab Results  Component Value Date   HDL 32 (L) 06/09/2019   Lab Results  Component Value Date   LDLCALC 105 (H) 06/09/2019   Lab Results  Component Value Date   TRIG 323 (H) 06/09/2019   Lab Results  Component Value Date   CHOLHDL 6.0 (H) 06/09/2019   Lab Results  Component Value Date   HGBA1C 9.6 (A) 06/09/2019      Assessment & Plan:   Problem List Items Addressed This Visit      Cardiovascular and Mediastinum   Essential hypertension  - Patient's blood pressure is at goal of 139/89 or less. Condition is stable. Continue current medications and treatment plan. I recommend that you exercise for 30-45 minutes 5 days a week. I also recommend a balanced diet with fruits and vegetables every day, lean meats, and little fried foods. The DASH diet (you can find this online) is a good example of this.    Relevant Orders   Lipid panel (Completed)    Other Visit Diagnoses    Type 2 diabetes mellitus with hyperglycemia, with long-term current use of insulin ([place])    -  Primary  Refill for Trulicity sent in Discussed that his diabetes was previously controlled Will keep dose the same for now   Relevant Medications   Dulaglutide (TRULICITY) 1.5 IW/9.7LG SOPN   Other Relevant Orders   HM Diabetes Foot Exam (Completed)   POCT glycosylated hemoglobin (Hb A1C) (Completed)   CMP14+EGFR (Completed)   Right sided sciatica    -  Advised pt to follow up with PT for exercise rehab   Relevant Medications   gabapentin (NEURONTIN) 100 MG capsule   Other Relevant Orders   DG Lumbar Spine  Complete (Completed)   Ambulatory referral to Physical Therapy      Meds  ordered this encounter  Medications  . Dulaglutide (TRULICITY) 1.5 DA/3.7CK SOPN    Sig: Inject 1 Dose into the skin once a week.    Dispense:  12 pen    Refill:  1  . gabapentin (NEURONTIN) 100 MG capsule    Sig: Take 1 capsule (100 mg total) by mouth 2 (two) times daily.    Dispense:  60 capsule    Refill:  3    Follow-up: Return in about 4 weeks (around 07/07/2019).    Forrest Moron, MD

## 2019-06-09 NOTE — Patient Instructions (Signed)
° ° ° °  If you have lab work done today you will be contacted with your lab results within the next 2 weeks.  If you have not heard from us then please contact us. The fastest way to get your results is to register for My Chart. ° ° °IF you received an x-ray today, you will receive an invoice from [facility]. Please contact [facility] at [phone] with questions or concerns regarding your invoice.  ° °IF you received labwork today, you will receive an invoice from [facility]. Please contact [facility] at [phone] with questions or concerns regarding your invoice.  ° °Our billing staff will not be able to assist you with questions regarding bills from these companies. ° °You will be contacted with the lab results as soon as they are available. The fastest way to get your results is to activate your My Chart account. Instructions are located on the last page of this paperwork. If you have not heard from us regarding the results in 2 weeks, please contact this office. °  ° ° ° °

## 2019-06-10 LAB — CMP14+EGFR
ALT: 22 IU/L (ref 0–44)
AST: 17 IU/L (ref 0–40)
Albumin/Globulin Ratio: 1.3 (ref 1.2–2.2)
Albumin: 4.1 g/dL (ref 4.0–5.0)
Alkaline Phosphatase: 65 IU/L (ref 39–117)
BUN/Creatinine Ratio: 23 — ABNORMAL HIGH (ref 9–20)
BUN: 21 mg/dL (ref 6–24)
Bilirubin Total: 0.3 mg/dL (ref 0.0–1.2)
CO2: 22 mmol/L (ref 20–29)
Calcium: 9.2 mg/dL (ref 8.7–10.2)
Chloride: 103 mmol/L (ref 96–106)
Creatinine, Ser: 0.9 mg/dL (ref 0.76–1.27)
GFR calc Af Amer: 116 mL/min/{1.73_m2} (ref >59)
GFR calc non Af Amer: 101 mL/min/{1.73_m2} (ref >59)
Globulin, Total: 3.1 g/dL (ref 1.5–4.5)
Glucose: 425 mg/dL — ABNORMAL HIGH (ref 65–99)
Potassium: 5.1 mmol/L (ref 3.5–5.2)
Sodium: 139 mmol/L (ref 134–144)
Total Protein: 7.2 g/dL (ref 6.0–8.5)

## 2019-06-10 LAB — LIPID PANEL
Chol/HDL Ratio: 6 ratio — ABNORMAL HIGH (ref 0.0–5.0)
Cholesterol, Total: 192 mg/dL (ref 100–199)
HDL: 32 mg/dL — ABNORMAL LOW (ref >39)
LDL Chol Calc (NIH): 105 mg/dL — ABNORMAL HIGH (ref 0–99)
Triglycerides: 323 mg/dL — ABNORMAL HIGH (ref 0–149)
VLDL Cholesterol Cal: 55 mg/dL — ABNORMAL HIGH (ref 5–40)

## 2019-07-02 ENCOUNTER — Other Ambulatory Visit: Payer: Self-pay | Admitting: Family Medicine

## 2019-07-02 DIAGNOSIS — E1165 Type 2 diabetes mellitus with hyperglycemia: Secondary | ICD-10-CM

## 2019-07-02 NOTE — Telephone Encounter (Signed)
Requested Prescriptions  Pending Prescriptions Disp Refills  . XIGDUO XR 07-998 MG TB24 [Pharmacy Med Name: XIGDUO XR 5 MG-1,000 MG TABLET] 180 tablet 1    Sig: TAKE 1 TABLET BY MOUTH 2 (TWO) TIMES DAILY WITH A MEAL. E11.65     Endocrinology:  Diabetes - Biguanide + SGLT2 Inhibitor Combos Failed - 07/02/2019  3:15 AM      Failed - LDL in normal range and within 360 days    LDL Chol Calc (NIH)  Date Value Ref Range Status  06/09/2019 105 (H) 0 - 99 mg/dL Final         Failed - HBA1C is between 0 and 7.9 and within 180 days    Hemoglobin A1C  Date Value Ref Range Status  06/09/2019 9.6 (A) 4.0 - 5.6 % Final   Hgb A1c MFr Bld  Date Value Ref Range Status  04/19/2017 9.9 (H) 4.8 - 5.6 % Final    Comment:             Prediabetes: 5.7 - 6.4          Diabetes: >6.4          Glycemic control for adults with diabetes: <7.0          Passed - Cr in normal range and within 360 days    Creat  Date Value Ref Range Status  04/22/2015 0.89 0.60 - 1.35 mg/dL Final   Creatinine, Ser  Date Value Ref Range Status  06/09/2019 0.90 0.76 - 1.27 mg/dL Final   Creatinine, Urine  Date Value Ref Range Status  01/06/2013 82.5 mg/dL Final         Passed - eGFR in normal range and within 360 days    GFR, Est African American  Date Value Ref Range Status  12/24/2014 >89 >=60 mL/min Final   GFR calc Af Amer  Date Value Ref Range Status  06/09/2019 116 >59 mL/min/1.73 Final   GFR, Est Non African American  Date Value Ref Range Status  12/24/2014 >89 >=60 mL/min Final    Comment:      The estimated GFR is a calculation valid for adults (>=7 years old) that uses the CKD-EPI algorithm to adjust for age and sex. It is   not to be used for children, pregnant women, hospitalized patients,    patients on dialysis, or with rapidly changing kidney function. According to the NKDEP, eGFR >89 is normal, 60-89 shows mild impairment, 30-59 shows moderate impairment, 15-29 shows severe impairment and  <15 is ESRD.      GFR calc non Af Amer  Date Value Ref Range Status  06/09/2019 101 >59 mL/min/1.73 Final         Passed - Valid encounter within last 6 months    Recent Outpatient Visits          3 weeks ago Type 2 diabetes mellitus with hyperglycemia, with long-term current use of insulin ([place])   [facility] at [place], MD   3 months ago Diabetes mellitus type 2, insulin dependent ([place])   [facility] at [place], MD   6 months ago Uncontrolled type 2 diabetes mellitus with hyperglycemia [facility])   Primary [facility] at [place], MD   1 year ago Elevated hemoglobin ([place])   Primary Care at [facility], Arlie Solomons, MD   1 year ago Essential hypertension   Primary [facility] at [place], MD      Future Appointments  In 1 week Forrest Moron, MD Primary Care at [place], [facility]

## 2019-07-09 ENCOUNTER — Ambulatory Visit: Payer: Commercial Managed Care - PPO | Admitting: Family Medicine

## 2019-07-09 ENCOUNTER — Other Ambulatory Visit: Payer: Self-pay

## 2019-07-09 VITALS — BP 104/74 | HR 83 | Temp 98.2°F | Ht 66.0 in | Wt 177.6 lb

## 2019-07-09 DIAGNOSIS — M5431 Sciatica, right side: Secondary | ICD-10-CM | POA: Diagnosis not present

## 2019-07-09 DIAGNOSIS — E1165 Type 2 diabetes mellitus with hyperglycemia: Secondary | ICD-10-CM | POA: Diagnosis not present

## 2019-07-09 LAB — GLUCOSE, POCT (MANUAL RESULT ENTRY): POC Glucose: 111 mg/dl — AB (ref 70–99)

## 2019-07-09 NOTE — Patient Instructions (Addendum)
If you have lab work done today you will be contacted with your lab results within the next 2 weeks.  If you have not heard from [place] then please contact us. The fastest way to get your results is to register for My Chart.   IF you received an x-ray today, you will receive an invoice from [facility]. Please contact [facility] at [phone] with questions or concerns regarding your invoice.   IF you received labwork today, you will receive an invoice from [place]. Please contact [facility] at [phone] with questions or concerns regarding your invoice.   Our billing staff will not be able to assist you with questions regarding bills from these companies.  You will be contacted with the lab results as soon as they are available. The fastest way to get your results is to activate your My Chart account. Instructions are located on the last page of this paperwork. If you have not heard from [place] regarding the results in 2 weeks, please contact this office.     Sacroiliac Joint Injection  A sacroiliac (SI) joint injection is a procedure to inject a numbing medicine (anesthetic block)--and sometimes a strong anti-inflammatory medicine (steroid)--into the SI joint. The SI joint is the joint between two bones of the pelvis called the sacrum and the ilium. The sacrum is the bone at the base of the spine. The ilium is the large bone that forms the hip. You may need this procedure if you have pain because of an inflamed or diseased SI joint. Various conditions can cause pain in the SI joint, including rheumatoid arthritis, gout, psoriatic arthritis, infection, or injury. SI joint pain is a common cause of low back pain. It may also cause pain in your buttock or leg. SI joint injection may be done to:  Find out if an anesthetic block relieves pain. This can confirm that the SI joint is the cause of pain (diagnostic use).  Treat a painful SI joint with steroids, anesthetic  medicine, or both (therapeutic use). Tell a health care provider about:  Any allergies you have.  All medicines you are taking, including vitamins, herbs, eye drops, creams, and over-the-counter medicines.  Any problems you or family members have had with anesthetic medicines.  Any blood disorders you have.  Any surgeries you have had.  Any medical conditions you have.  Whether you are pregnant or may be pregnant. What are the risks? Generally, this is a safe procedure. However, problems may occur, including:  Infection.  Bleeding.  Nerve injury.  Temporary increase in pain.  Headache.  Failure of the procedure to relieve pain.  Bruising or soreness at the joint, in deep tissues, or at the injection site.  Allergic reactions to medicines or dyes.  Side effects from the steroid medicine. These may include facial flushing, increased appetite, diarrhea, and increased blood sugar. What happens before the procedure?  You may have a physical exam.  You may have imaging tests, such as an X-ray, CT scan, or MRI.  Ask your health care provider about: ? Changing or stopping your regular medicines. This is especially important if you are taking diabetes medicines or blood thinners. ? Taking medicines such as aspirin and ibuprofen. These medicines can thin your blood. Do not take these medicines unless your health care provider tells you to take them. ? Taking over-the-counter medicines, vitamins, herbs, and supplements.  Plan to have someone take you home from the hospital or clinic. What happens during the  procedure?  To lower your risk of infection: ? Your health care team will wash or sanitize their hands. ? Your skin will be washed with a germ-killing (antiseptic) solution.  You may be given one or more of the following: ? A medicine to help you relax (sedative). ? A medicine to numb the area (local anesthetic). Your health care provider will inject a local anesthetic  into the skin above your SI joint.  You will be placed in the proper position on a procedure table to give the health care team the best access to your SI joint.  An X-ray machine that produces moving X-ray images (fluoroscopy) will be placed above the procedure table.  A long, thin needle will be inserted through your skin and down to your SI joint.  The position of the needle will be checked with fluoroscopy imaging.  An X-ray dye (contrast media) will be injected to make sure the needle enters the joint space. You may be asked if you feel any pain.  Long-acting anesthetic medicine will be injected. Long-acting steroid medicine may also be injected.  The needle will be removed, and a bandage will be placed over the injection site. The procedure may vary among health care providers and hospitals. What happens after the procedure?  Your blood pressure, heart rate, breathing rate, and blood oxygen level will be monitored until the medicines you were given have worn off.  If dye was used, you will be told to drink plenty of water to wash (flush) the dye out of your body.  You may be asked if you have pain relief from the injection.  You will likely be able to go home shortly after the procedure.  Your health care provider will give you instructions for taking care of yourself after the procedure. These may include instructions for doing physical therapy exercises.  Do not drive for 24 hours if you were given a sedative during the procedure. Summary  A sacroiliac (SI) joint injection is an injection of a numbing medicine (anesthetic block)--and sometimes a strong anti-inflammatory medicine (steroid)--into the SI joint.  You will be awake during the procedure, but the injection area will be made numb.  If you were given a medicine to help you relax (sedative during the procedure, do not drive for at least 24 hours. This information is not intended to replace advice given to you by  your health care provider. Make sure you discuss any questions you have with your health care provider. Document Revised: 02/02/2017 Document Reviewed: 11/27/2016 Elsevier Patient Education  2020 [employer].

## 2019-07-09 NOTE — Progress Notes (Signed)
Established Patient Office Visit  Subjective:  Patient ID: Jake Miller, male    DOB: 1971/01/24  Age: 49 y.o. MRN: [id number]  CC:  Chief Complaint  Patient presents with  . Follow-up    Diabetes    HPI Dallen Cartlidge presents for    Right side sciatica Pt states that he took gabapentin and there is no improvement in his right side low back pain and radiating pain He staets that the pain goes down the leg to the knee and back up again. He staets that it is worse with walking and standing long periods He states that he feels like with heavy liftin he has less balance on the right side   Diabetes Mellitus: Patient presents for follow up of diabetes. Symptoms: hyperglycemia. Symptoms have gradually improved with resuming trulicity and observe calorie restriction. Patient denies hypoglycemia .  Evaluation to date has been included: hemoglobin A1C.  Home sugars: BGs range between 170 and 200.   Lab Results  Component Value Date   HGBA1C 9.6 (A) 06/09/2019   Glucose 111 Pt reports that he is fasting for Ramadan from 5am to 8pm.  He takes his meds when he breaks the fast. He is taking insulin glargine 40 units at 8pm and metformin. He does trulicity once a week as instructed. He has 7 more days of Ramadan  Past Medical History:  Diagnosis Date  . Diabetes mellitus   . GERD (gastroesophageal reflux disease)   . Hypertension     No past surgical history on file.  No family history on file.  Social History   Socioeconomic History  . Marital status: Married    Spouse name: Not on file  . Number of children: 3  . Years of education: Not on file  . Highest education level: Not on file  Occupational History  . Not on file  Tobacco Use  . Smoking status: Former Research scientist (life sciences)  . Smokeless tobacco: Current User  Substance and Sexual Activity  . Alcohol use: No    Alcohol/week: 0.0 standard drinks  . Drug use: No  . Sexual activity: Yes  Other Topics Concern  . Not on file    Social History Narrative  . Not on file   Social Determinants of Health   Financial Resource Strain:   . Difficulty of Paying Living Expenses:   Food Insecurity:   . Worried About Charity fundraiser in the Last Year:   . Arboriculturist in the Last Year:   Transportation Needs:   . Film/video editor (Medical):   Marland Kitchen Lack of Transportation (Non-Medical):   Physical Activity:   . Days of Exercise per Week:   . Minutes of Exercise per Session:   Stress:   . Feeling of Stress :   Social Connections:   . Frequency of Communication with Friends and Family:   . Frequency of Social Gatherings with Friends and Family:   . Attends Religious Services:   . Active Member of Clubs or Organizations:   . Attends Archivist Meetings:   Marland Kitchen Marital Status:   Intimate Partner Violence:   . Fear of Current or Ex-Partner:   . Emotionally Abused:   Marland Kitchen Physically Abused:   . Sexually Abused:     Outpatient Medications Prior to Visit  Medication Sig Dispense Refill  . aspirin EC 81 MG tablet Take 81 mg by mouth daily.    Marland Kitchen atorvastatin (LIPITOR) 20 MG tablet Take 1 tablet (20 mg total)  by mouth daily. 90 tablet 3  . blood glucose meter kit and supplies KIT Check blood sugar in morning before eating breakfast, and one hour after a meal. Dx codes: E11.9, Z79.4. 1 each 0  . cyclobenzaprine (FLEXERIL) 5 MG tablet Take 2 tablets (10 mg total) by mouth at bedtime. 90 tablet 1  . Dulaglutide (TRULICITY) 1.5 MB/8.4YK SOPN Inject 1 Dose into the skin once a week. 12 pen 1  . famotidine (PEPCID) 20 MG tablet Take 1 tablet (20 mg total) by mouth 2 (two) times daily before a meal. 60 tablet 11  . gabapentin (NEURONTIN) 100 MG capsule Take 1 capsule (100 mg total) by mouth 2 (two) times daily. 60 capsule 3  . glucose blood (ONE TOUCH ULTRA TEST) test strip CHECK BLOOD SUGAR IN MORNING BEFORE EATING BREAKFAST AND 1 HOUR AFTER A MEAL 100 each 3  . Insulin Glargine (BASAGLAR KWIKPEN) 100 UNIT/ML SOPN  INJECT 40 UNITS AT BEDTIME DAILY FOR GOAL FASTING GLUCOSE OF 140 OR LESS E11.65 10 pen 1  . lisinopril (ZESTRIL) 10 MG tablet TAKE 1 TABLET BY MOUTH EVERY DAY 90 tablet 1  . ONE TOUCH LANCETS MISC Check blood sugar in morning before eating breakfast, and one hour after a meal. Dx codes: E11.9, Z79.4. 200 each 3  . XIGDUO XR 07-998 MG TB24 TAKE 1 TABLET BY MOUTH 2 (TWO) TIMES DAILY WITH A MEAL. E11.65 180 tablet 1   No facility-administered medications prior to visit.    No Known Allergies  ROS Review of Systems Review of Systems  Constitutional: Negative for activity change, appetite change, chills and fever.  HENT: Negative for congestion, nosebleeds, trouble swallowing and voice change.   Respiratory: Negative for cough, shortness of breath and wheezing.   Gastrointestinal: Negative for diarrhea, nausea and vomiting.  Genitourinary: Negative for difficulty urinating, dysuria, flank pain and hematuria.  Musculoskeletal: Negative for back pain, joint swelling and neck pain.  Neurological: Negative for dizziness, speech difficulty, light-headedness and numbness.  See HPI. All other review of systems negative.     Objective:    Physical Exam  BP 104/74 (BP Location: Right Arm, Patient Position: Sitting, Cuff Size: Normal)   Pulse 83   Temp 98.2 F (36.8 C) (Temporal)   Ht '5\' 6"'  (1.676 m)   Wt 177 lb 9.6 oz (80.6 kg)   SpO2 96%   BMI 28.67 kg/m  Wt Readings from Last 3 Encounters:  07/09/19 177 lb 9.6 oz (80.6 kg)  06/09/19 184 lb (83.5 kg)  03/10/19 182 lb 9.6 oz (82.8 kg)   Physical Exam  Constitutional: Oriented to person, place, and time. Appears well-developed and well-nourished.  HENT:  Head: Normocephalic and atraumatic.  Eyes: Conjunctivae and EOM are normal.  Cardiovascular: Normal rate, regular rhythm, normal heart sounds and intact distal pulses.  No murmur heard. Pulmonary/Chest: Effort normal and breath sounds normal. No stridor. No respiratory distress. Has  no wheezes.  Neurological: Is alert and oriented to person, place, and time.  Skin: Skin is warm. Capillary refill takes less than 2 seconds.  Psychiatric: Has a normal mood and affect. Behavior is normal. Judgment and thought content normal.   Tender over the right SI joint  Normal gait    Health Maintenance Due  Topic Date Due  . COVID-19 Vaccine (1) Never done  . OPHTHALMOLOGY EXAM  02/07/2019    There are no preventive care reminders to display for this patient.  Lab Results  Component Value Date   TSH 1.920 04/19/2017  Lab Results  Component Value Date   WBC 7.2 04/25/2018   HGB 16.5 04/25/2018   HCT 45.8 04/25/2018   MCV 83 04/25/2018   PLT 266 04/25/2018   Lab Results  Component Value Date   NA 139 06/09/2019   K 5.1 06/09/2019   CO2 22 06/09/2019   GLUCOSE 425 (H) 06/09/2019   BUN 21 06/09/2019   CREATININE 0.90 06/09/2019   BILITOT 0.3 06/09/2019   ALKPHOS 65 06/09/2019   AST 17 06/09/2019   ALT 22 06/09/2019   PROT 7.2 06/09/2019   ALBUMIN 4.1 06/09/2019   CALCIUM 9.2 06/09/2019   Lab Results  Component Value Date   CHOL 192 06/09/2019   Lab Results  Component Value Date   HDL 32 (L) 06/09/2019   Lab Results  Component Value Date   LDLCALC 105 (H) 06/09/2019   Lab Results  Component Value Date   TRIG 323 (H) 06/09/2019   Lab Results  Component Value Date   CHOLHDL 6.0 (H) 06/09/2019   Lab Results  Component Value Date   HGBA1C 9.6 (A) 06/09/2019      Assessment & Plan:   Problem List Items Addressed This Visit    None    Visit Diagnoses    Uncontrolled type 2 diabetes mellitus with hyperglycemia (HCC)    -  Primary Improving, a1c should improve with compliance with meds He does not show hypoglycemia during Ramadan which was the reason for today's visit   Relevant Orders   POCT glucose (manual entry) (Completed)   Right sided sciatica    -  May benefit from steroid injection in the SI joint Would not use prednisone since he  has uncontrolled diabetes Would avoid NSAIDs since he is fasting for Ramadan Advised follow up with Orthopedics Referral placed today   Relevant Orders   Ambulatory referral to Orthopedic Surgery      No orders of the defined types were placed in this encounter.   Follow-up: No follow-ups on file.    Forrest Moron, MD

## 2019-10-13 ENCOUNTER — Other Ambulatory Visit: Payer: Self-pay

## 2019-10-13 ENCOUNTER — Telehealth: Payer: Self-pay | Admitting: Emergency Medicine

## 2019-10-13 ENCOUNTER — Telehealth: Payer: Self-pay | Admitting: Family Medicine

## 2019-10-13 DIAGNOSIS — Z794 Long term (current) use of insulin: Secondary | ICD-10-CM

## 2019-10-13 NOTE — Telephone Encounter (Signed)
Pt has a TOC scheduled  ofr 12/15/2019 and is asking for courtesy refills on  What is the name of the medication?Insulin Glargine (BASAGLAR KWIKPEN) 100 UNIT  Dulaglutide (TRULICITY) 1.5 MG/0.5ML SOPN [[id number]]   Have you contacted your pharmacy to request a refill Y  Which pharmacy would you like this sent to? [id number] [address]  [address], [place] [address]  Phone:  [phone] Fax:  [phone]    Patient notified that their request is being sent to the clinical staff for review and that they should receive a call once it is complete. If they do not receive a call within 72 hours they can check with their pharmacy or our office.

## 2019-10-13 NOTE — Telephone Encounter (Signed)
Referral followup 

## 2019-10-14 ENCOUNTER — Other Ambulatory Visit: Payer: Self-pay | Admitting: Emergency Medicine

## 2019-10-14 DIAGNOSIS — Z794 Long term (current) use of insulin: Secondary | ICD-10-CM

## 2019-10-14 MED ORDER — BASAGLAR KWIKPEN 100 UNIT/ML ~~LOC~~ SOPN: PEN_INJECTOR | SUBCUTANEOUS | 0 refills | Status: DC

## 2019-10-14 MED ORDER — TRULICITY 1.5 MG/0.5ML ~~LOC~~ SOAJ: SUBCUTANEOUS | 0 refills | Status: DC

## 2019-10-14 NOTE — Telephone Encounter (Signed)
Msg was left on patient machine that rx has been sent

## 2019-10-14 NOTE — Telephone Encounter (Signed)
Rx has been sent to the pharmacy

## 2019-11-28 ENCOUNTER — Ambulatory Visit: Payer: Commercial Managed Care - PPO

## 2019-11-28 ENCOUNTER — Other Ambulatory Visit: Payer: Self-pay

## 2019-12-14 ENCOUNTER — Other Ambulatory Visit: Payer: Self-pay | Admitting: Family Medicine

## 2019-12-14 DIAGNOSIS — E1165 Type 2 diabetes mellitus with hyperglycemia: Secondary | ICD-10-CM

## 2019-12-15 ENCOUNTER — Encounter: Payer: Self-pay | Admitting: Emergency Medicine

## 2019-12-15 ENCOUNTER — Other Ambulatory Visit: Payer: Self-pay

## 2019-12-15 ENCOUNTER — Ambulatory Visit: Payer: Commercial Managed Care - PPO | Admitting: Emergency Medicine

## 2019-12-15 VITALS — BP 129/77 | HR 76 | Temp 97.9°F | Resp 16 | Ht 68.0 in | Wt 188.0 lb

## 2019-12-15 DIAGNOSIS — Z794 Long term (current) use of insulin: Secondary | ICD-10-CM

## 2019-12-15 DIAGNOSIS — E1165 Type 2 diabetes mellitus with hyperglycemia: Secondary | ICD-10-CM | POA: Diagnosis not present

## 2019-12-15 DIAGNOSIS — I152 Hypertension secondary to endocrine disorders: Secondary | ICD-10-CM

## 2019-12-15 DIAGNOSIS — Z7689 Persons encountering health services in other specified circumstances: Secondary | ICD-10-CM

## 2019-12-15 DIAGNOSIS — I10 Essential (primary) hypertension: Secondary | ICD-10-CM | POA: Diagnosis not present

## 2019-12-15 DIAGNOSIS — E1159 Type 2 diabetes mellitus with other circulatory complications: Secondary | ICD-10-CM

## 2019-12-15 DIAGNOSIS — Z1159 Encounter for screening for other viral diseases: Secondary | ICD-10-CM

## 2019-12-15 LAB — POCT GLYCOSYLATED HEMOGLOBIN (HGB A1C): Hemoglobin A1C: 10.3 % — AB (ref 4.0–5.6)

## 2019-12-15 LAB — GLUCOSE, POCT (MANUAL RESULT ENTRY): POC Glucose: 203 mg/dl — AB (ref 70–99)

## 2019-12-15 MED ORDER — BASAGLAR KWIKPEN 100 UNIT/ML ~~LOC~~ SOPN: PEN_INJECTOR | SUBCUTANEOUS | 0 refills | Status: DC

## 2019-12-15 MED ORDER — XIGDUO XR 5-1000 MG PO TB24: 1.0000 | ORAL_TABLET | Freq: Two times a day (BID) | ORAL | 1 refills | Status: DC

## 2019-12-15 MED ORDER — TRULICITY 1.5 MG/0.5ML ~~LOC~~ SOAJ: SUBCUTANEOUS | 3 refills | Status: DC

## 2019-12-15 MED ORDER — LISINOPRIL 10 MG PO TABS: 10.0000 mg | ORAL_TABLET | Freq: Every day | ORAL | 3 refills | Status: DC

## 2019-12-15 NOTE — Patient Instructions (Addendum)
   If you have lab work done today you will be contacted with your lab results within the next 2 weeks.  If you have not heard from us then please contact us. The fastest way to get your results is to register for My Chart.   IF you received an x-ray today, you will receive an invoice from [facility]. Please contact [facility] at [phone] with questions or concerns regarding your invoice.   IF you received labwork today, you will receive an invoice from [facility]. Please contact [facility] at [phone] with questions or concerns regarding your invoice.   Our billing staff will not be able to assist you with questions regarding bills from these companies.  You will be contacted with the lab results as soon as they are available. The fastest way to get your results is to activate your My Chart account. Instructions are located on the last page of this paperwork. If you have not heard from us regarding the results in 2 weeks, please contact this office.      Diabetes Mellitus and Nutrition, Adult When you have diabetes (diabetes mellitus), it is very important to have healthy eating habits because your blood sugar (glucose) levels are greatly affected by what you eat and drink. Eating healthy foods in the appropriate amounts, at about the same times every day, can help you:  Control your blood glucose.  Lower your risk of heart disease.  Improve your blood pressure.  Reach or maintain a healthy weight. Every person with diabetes is different, and each person has different needs for a meal plan. Your health care provider may recommend that you work with a diet and nutrition specialist (dietitian) to make a meal plan that is best for you. Your meal plan may vary depending on factors such as:  The calories you need.  The medicines you take.  Your weight.  Your blood glucose, blood pressure, and cholesterol levels.  Your activity level.  Other health  conditions you have, such as heart or kidney disease. How do carbohydrates affect me? Carbohydrates, also called carbs, affect your blood glucose level more than any other type of food. Eating carbs naturally raises the amount of glucose in your blood. Carb counting is a method for keeping track of how many carbs you eat. Counting carbs is important to keep your blood glucose at a healthy level, especially if you use insulin or take certain oral diabetes medicines. It is important to know how many carbs you can safely have in each meal. This is different for every person. Your dietitian can help you calculate how many carbs you should have at each meal and for each snack. Foods that contain carbs include:  Bread, cereal, rice, pasta, and crackers.  Potatoes and corn.  Peas, beans, and lentils.  Milk and yogurt.  Fruit and juice.  Desserts, such as cakes, cookies, ice cream, and candy. How does alcohol affect me? Alcohol can cause a sudden decrease in blood glucose (hypoglycemia), especially if you use insulin or take certain oral diabetes medicines. Hypoglycemia can be a life-threatening condition. Symptoms of hypoglycemia (sleepiness, dizziness, and confusion) are similar to symptoms of having too much alcohol. If your health care provider says that alcohol is safe for you, follow these guidelines:  Limit alcohol intake to no more than 1 drink per day for nonpregnant women and 2 drinks per day for men. One drink equals 12 oz of beer, 5 oz of wine, or 1 oz of hard   liquor.  Do not drink on an empty stomach.  Keep yourself hydrated with water, diet soda, or unsweetened iced tea.  Keep in mind that regular soda, juice, and other mixers may contain a lot of sugar and must be counted as carbs. What are tips for following this plan?  Reading food labels  Start by checking the serving size on the "Nutrition Facts" label of packaged foods and drinks. The amount of calories, carbs, fats, and  other nutrients listed on the label is based on one serving of the item. Many items contain more than one serving per package.  Check the total grams (g) of carbs in one serving. You can calculate the number of servings of carbs in one serving by dividing the total carbs by 15. For example, if a food has 30 g of total carbs, it would be equal to 2 servings of carbs.  Check the number of grams (g) of saturated and trans fats in one serving. Choose foods that have low or no amount of these fats.  Check the number of milligrams (mg) of salt (sodium) in one serving. Most people should limit total sodium intake to less than 2,300 mg per day.  Always check the nutrition information of foods labeled as "low-fat" or "nonfat". These foods may be higher in added sugar or refined carbs and should be avoided.  Talk to your dietitian to identify your daily goals for nutrients listed on the label. Shopping  Avoid buying canned, premade, or processed foods. These foods tend to be high in fat, sodium, and added sugar.  Shop around the outside edge of the grocery store. This includes fresh fruits and vegetables, bulk grains, fresh meats, and fresh dairy. Cooking  Use low-heat cooking methods, such as baking, instead of high-heat cooking methods like deep frying.  Cook using healthy oils, such as olive, canola, or sunflower oil.  Avoid cooking with butter, cream, or high-fat meats. Meal planning  Eat meals and snacks regularly, preferably at the same times every day. Avoid going long periods of time without eating.  Eat foods high in fiber, such as fresh fruits, vegetables, beans, and whole grains. Talk to your dietitian about how many servings of carbs you can eat at each meal.  Eat 4-6 ounces (oz) of lean protein each day, such as lean meat, chicken, fish, eggs, or tofu. One oz of lean protein is equal to: ? 1 oz of meat, chicken, or fish. ? 1 egg. ?  cup of tofu.  Eat some foods each day that  contain healthy fats, such as avocado, nuts, seeds, and fish. Lifestyle  Check your blood glucose regularly.  Exercise regularly as told by your health care provider. This may include: ? 150 minutes of moderate-intensity or vigorous-intensity exercise each week. This could be brisk walking, biking, or water aerobics. ? Stretching and doing strength exercises, such as yoga or weightlifting, at least 2 times a week.  Take medicines as told by your health care provider.  Do not use any products that contain nicotine or tobacco, such as cigarettes and e-cigarettes. If you need help quitting, ask your health care provider.  Work with a counselor or diabetes educator to identify strategies to manage stress and any emotional and social challenges. Questions to ask a health care provider  Do I need to meet with a diabetes educator?  Do I need to meet with a dietitian?  What number can I call if I have questions?  When are the best   times to check my blood glucose? Where to find more information:  American Diabetes Association: diabetes.org  Academy of Nutrition and Dietetics: www.eatright.org  National Institute of Diabetes and Digestive and Kidney Diseases (NIH): www.niddk.nih.gov Summary  A healthy meal plan will help you control your blood glucose and maintain a healthy lifestyle.  Working with a diet and nutrition specialist (dietitian) can help you make a meal plan that is best for you.  Keep in mind that carbohydrates (carbs) and alcohol have immediate effects on your blood glucose levels. It is important to count carbs and to use alcohol carefully. This information is not intended to replace advice given to you by your health care provider. Make sure you discuss any questions you have with your health care provider. Document Revised: 02/02/2017 Document Reviewed: 03/27/2016 Elsevier Patient Education  2020 [employer]  

## 2019-12-15 NOTE — Progress Notes (Signed)
Jake Miller 49 y.o.   Chief Complaint  Patient presents with  . Transitions Of Care    former Dr Nolon Rod patient  . Medication Refill    PEND    HISTORY OF PRESENT ILLNESS: This is a 49 y.o. male here to establish care with me, used to see Dr. Nolon Rod. Has history of diabetes, hypertension, and dyslipidemia. 1.  Diabetes: On weekly Trulicity 1.5 mg, Xigduo XR twice a day, and bedtime insulin glargine 50 units. Blood glucose readings at home within normal range per patient.   Lab Results  Component Value Date   HGBA1C 9.6 (A) 06/09/2019   2.  Hypertension: On lisinopril 10 mg daily BP Readings from Last 3 Encounters:  12/15/19 129/77  07/09/19 104/74  06/09/19 122/72    3.  Dyslipidemia: On atorvastatin 20 mg daily.  The 10-year ASCVD risk score Mikey Bussing DC Brooke Bonito., et al., 2013) is: 9.8%   Values used to calculate the score:     Age: 81 years     Sex: Male     Is Non-Hispanic African American: No     Diabetic: Yes     Tobacco smoker: No     Systolic Blood Pressure: 829 mmHg     Is BP treated: Yes     HDL Cholesterol: 32 mg/dL     Total Cholesterol: 192 mg/dL First Covid vaccine shot on 11/28/2019. No complaints or medical concerns today.   HPI   Prior to Admission medications   Medication Sig Start Date End Date Taking? Authorizing Provider  aspirin EC 81 MG tablet Take 81 mg by mouth daily.   Yes [provider]  atorvastatin (LIPITOR) 20 MG tablet Take 1 tablet (20 mg total) by mouth daily. 04/25/18  Yes Delia Chimes A, MD  cyclobenzaprine (FLEXERIL) 5 MG tablet Take 2 tablets (10 mg total) by mouth at bedtime. 04/25/18  Yes Forrest Moron, MD  Dulaglutide (TRULICITY) 1.5 FA/2.1HY SOPN One dose in the skin once a week 10/14/19  Yes Rutherford Guys, MD  famotidine (PEPCID) 20 MG tablet Take 1 tablet (20 mg total) by mouth 2 (two) times daily before a meal. 03-19-2018  Yes Stallings, Zoe A, MD  gabapentin (NEURONTIN) 100 MG capsule Take 1 capsule (100 mg  total) by mouth 2 (two) times daily. 06/09/19  Yes Forrest Moron, MD  Insulin Glargine (BASAGLAR KWIKPEN) 100 UNIT/ML Inject 40 units at bedtime daily E11.65, Z79.4 10/14/19  Yes Pamella Pert, Irma M, MD  lisinopril (ZESTRIL) 10 MG tablet TAKE 1 TABLET BY MOUTH EVERY DAY 04/18/19  Yes Stallings, Zoe A, MD  XIGDUO XR 07-998 MG TB24 TAKE 1 TABLET BY MOUTH 2 (TWO) TIMES DAILY WITH A MEAL. E11.65 07/02/19  Yes Stallings, Zoe A, MD  blood glucose meter kit and supplies KIT Check blood sugar in morning before eating breakfast, and one hour after a meal. Dx codes: E11.9, Z79.4. 16-Sep-2014   Brewington, Tishira R, PA-C  glucose blood (ONE TOUCH ULTRA TEST) test strip CHECK BLOOD SUGAR IN MORNING BEFORE EATING BREAKFAST AND 1 HOUR AFTER A MEAL 04/25/18   Forrest Moron, MD  ONE TOUCH LANCETS MISC Check blood sugar in morning before eating breakfast, and one hour after a meal. Dx codes: E11.9, Z79.4. 03-19-18   Forrest Moron, MD    No Known Allergies  Patient Active Problem List   Diagnosis Date Noted  . Type 2 diabetes mellitus with hyperlipidemia ([place]) 03-19-2018  . H. pylori infection March 19, 2018  . Uncontrolled type 2  diabetes mellitus without complication, with long-term current use of insulin 02/26/2016  . Left ankle injury 12/24/2014  . Essential hypertension 04/01/2014  . GERD (gastroesophageal reflux disease) 01/06/2013    Past Medical History:  Diagnosis Date  . Diabetes mellitus   . Diabetes mellitus without complication (HCC)    [employer]rees[employer] 12/12/2019  . GERD (gastroesophageal reflux disease)   . Hypertension     Past Surgical History:  Procedure Laterality Date  . EYE SURGERY N/A    [employer] 12/12/2019    Social History   Socioeconomic History  . Marital status: Married    Spouse name: Not on file  . Number of children: 3  . Years of education: Not on file  . Highest education level: Not on file  Occupational History  . Not on file  Tobacco Use  . Smoking status: Former  Smoker  . Smokeless tobacco: Current User  Vaping Use  . Vaping Use: Never used  Substance and Sexual Activity  . Alcohol use: No    Alcohol/week: 0.0 standard drinks  . Drug use: No  . Sexual activity: Yes  Other Topics Concern  . Not on file  Social History Narrative  . Not on file   Social Determinants of Health   Financial Resource Strain:   . Difficulty of Paying Living Expenses: Not on file  Food Insecurity:   . Worried About Running Out of Food in the Last Year: Not on file  . Ran Out of Food in the Last Year: Not on file  Transportation Needs:   . Lack of Transportation (Medical): Not on file  . Lack of Transportation (Non-Medical): Not on file  Physical Activity:   . Days of Exercise per Week: Not on file  . Minutes of Exercise per Session: Not on file  Stress:   . Feeling of Stress : Not on file  Social Connections:   . Frequency of Communication with Friends and Family: Not on file  . Frequency of Social Gatherings with Friends and Family: Not on file  . Attends Religious Services: Not on file  . Active Member of Clubs or Organizations: Not on file  . Attends Club or Organization Meetings: Not on file  . Marital Status: Not on file  Intimate Partner Violence:   . Fear of Current or Ex-Partner: Not on file  . Emotionally Abused: Not on file  . Physically Abused: Not on file  . Sexually Abused: Not on file    History reviewed. No pertinent family history.   Review of Systems  Constitutional: Negative.  Negative for chills and fever.  HENT: Negative.  Negative for congestion and sore throat.   Respiratory: Negative.  Negative for cough and shortness of breath.   Cardiovascular: Negative.  Negative for chest pain and palpitations.  Gastrointestinal: Negative.  Negative for abdominal pain, blood in stool, diarrhea, melena, nausea and vomiting.  Genitourinary: Negative.  Negative for dysuria and hematuria.  Skin: Negative.  Negative for rash.  Neurological:  Negative.  Negative for dizziness and headaches.  All other systems reviewed and are negative.   Today's Vitals   12/15/19 1614  BP: 129/77  Pulse: 76  Resp: 16  Temp: 97.9 F (36.6 C)  TempSrc: Temporal  SpO2: 97%  Weight: 188 lb (85.3 kg)  Height: 5' 8" (1.727 m)   Body mass index is 28.59 kg/m. Wt Readings from Last 3 Encounters:  12/15/19 188 lb (85.3 kg)  07/09/19 177 lb 9.6 oz (80.6 kg)  06/09/19 184 lb (  83.5 kg)    Physical Exam Constitutional:      Appearance: Normal appearance.  HENT:     Head: Normocephalic.  Eyes:     Extraocular Movements: Extraocular movements intact.     Conjunctiva/sclera: Conjunctivae normal.     Pupils: Pupils are equal, round, and reactive to light.  Cardiovascular:     Rate and Rhythm: Normal rate and regular rhythm.     Pulses: Normal pulses.     Heart sounds: Normal heart sounds.  Pulmonary:     Effort: Pulmonary effort is normal.     Breath sounds: Normal breath sounds.  Abdominal:     General: Bowel sounds are normal. There is no distension.     Palpations: Abdomen is soft.     Tenderness: There is no abdominal tenderness.  Musculoskeletal:     Cervical back: Normal range of motion and neck supple.  Skin:    General: Skin is warm and dry.     Capillary Refill: Capillary refill takes less than 2 seconds.  Neurological:     General: No focal deficit present.     Mental Status: He is alert and oriented to person, place, and time.  Psychiatric:        Mood and Affect: Mood normal.        Behavior: Behavior normal.     Results for orders placed or performed in visit on 12/15/19 (from the past 24 hour(s))  POCT glucose (manual entry)     Status: Abnormal   Collection Time: 12/15/19  4:34 PM  Result Value Ref Range   POC Glucose 203 (A) 70 - 99 mg/dl  POCT glycosylated hemoglobin (Hb A1C)     Status: Abnormal   Collection Time: 12/15/19  4:38 PM  Result Value Ref Range   Hemoglobin A1C 10.3 (A) 4.0 - 5.6 %   HbA1c POC  (<> result, manual entry)     HbA1c, POC (prediabetic range)     HbA1c, POC (controlled diabetic range)     A total of 45 minutes was spent with the patient, greater than 50% of which was in counseling/coordination of care regarding uncontrolled diabetes and cardiovascular risks associated with this condition, review of all medications and side effects, hypoglycemia precautions, education about nutrition and diabetes, review of most recent blood work results including today's hemoglobin A1c, establishing care, review of most recent office visit notes, prognosis, documentation, need for follow-up in 3 months.  ASSESSMENT & PLAN: Uncontrolled type 2 diabetes mellitus without complication, with long-term current use of insulin (HCC) Uncontrolled diabetes with hemoglobin A1c at 10.3.  Patient has been off Trulicity for over 2 months.  Continue bedtime glargine insulin, Xigduo XR, and restart Trulicity. Diet and nutrition discussed. Follow-up in 3 months.  Odin was seen today for transitions of care and medication refill.  Diagnoses and all orders for this visit:  Uncontrolled type 2 diabetes mellitus with hyperglycemia (HCC)  Type 2 diabetes mellitus with hyperglycemia, with long-term current use of insulin (HCC) -     Comprehensive metabolic panel -     Lipid panel -     POCT glucose (manual entry) -     POCT glycosylated hemoglobin (Hb A1C) -     Dulaglutide (TRULICITY) 1.5 MG/0.5ML SOPN; One dose in the skin once a week -     Insulin Glargine (BASAGLAR KWIKPEN) 100 UNIT/ML; Inject 40 units at bedtime daily E11.65, Z79.4 -     Dapagliflozin-metFORMIN HCl ER (XIGDUO XR) 07-998 MG TB24; Take 1   tablet by mouth 2 (two) times daily with a meal. E11.65  Essential hypertension -     lisinopril (ZESTRIL) 10 MG tablet; Take 1 tablet (10 mg total) by mouth daily.  Hypertension associated with diabetes ([place])  Encounter to establish care  Need for hepatitis C screening test -     Hepatitis C  antibody    Patient Instructions       If you have lab work done today you will be contacted with your lab results within the next 2 weeks.  If you have not heard from [place] then please contact us. The fastest way to get your results is to register for My Chart.   IF you received an x-ray today, you will receive an invoice from [facility]. Please contact [facility] at [phone] with questions or concerns regarding your invoice.   IF you received labwork today, you will receive an invoice from [facility]. Please contact [facility] at [phone] with questions or concerns regarding your invoice.   Our billing staff will not be able to assist you with questions regarding bills from these companies.  You will be contacted with the lab results as soon as they are available. The fastest way to get your results is to activate your My Chart account. Instructions are located on the last page of this paperwork. If you have not heard from [place] regarding the results in 2 weeks, please contact this office.     Diabetes Mellitus and Nutrition, Adult When you have diabetes (diabetes mellitus), it is very important to have healthy eating habits because your blood sugar (glucose) levels are greatly affected by what you eat and drink. Eating healthy foods in the appropriate amounts, at about the same times every day, can help you:  Control your blood glucose.  Lower your risk of heart disease.  Improve your blood pressure.  Reach or maintain a healthy weight. Every person with diabetes is different, and each person has different needs for a meal plan. Your health care provider may recommend that you work with a diet and nutrition specialist (dietitian) to make a meal plan that is best for you. Your meal plan may vary depending on factors such as:  The calories you need.  The medicines you take.  Your weight.  Your blood glucose, blood pressure, and cholesterol levels.  Your  activity level.  Other health conditions you have, such as heart or kidney disease. How do carbohydrates affect me? Carbohydrates, also called carbs, affect your blood glucose level more than any other type of food. Eating carbs naturally raises the amount of glucose in your blood. Carb counting is a method for keeping track of how many carbs you eat. Counting carbs is important to keep your blood glucose at a healthy level, especially if you use insulin or take certain oral diabetes medicines. It is important to know how many carbs you can safely have in each meal. This is different for every person. Your dietitian can help you calculate how many carbs you should have at each meal and for each snack. Foods that contain carbs include:  Bread, cereal, rice, pasta, and crackers.  Potatoes and corn.  Peas, beans, and lentils.  Milk and yogurt.  Fruit and juice.  Desserts, such as cakes, cookies, ice cream, and candy. How does alcohol affect me? Alcohol can cause a sudden decrease in blood glucose (hypoglycemia), especially if you use insulin or take certain oral diabetes medicines. Hypoglycemia can be a life-threatening condition. Symptoms of  hypoglycemia (sleepiness, dizziness, and confusion) are similar to symptoms of having too much alcohol. If your health care provider says that alcohol is safe for you, follow these guidelines:  Limit alcohol intake to no more than 1 drink per day for nonpregnant women and 2 drinks per day for men. One drink equals 12 oz of beer, 5 oz of wine, or 1 oz of hard liquor.  Do not drink on an empty stomach.  Keep yourself hydrated with water, diet soda, or unsweetened iced tea.  Keep in mind that regular soda, juice, and other mixers may contain a lot of sugar and must be counted as carbs. What are tips for following this plan?  Reading food labels  Start by checking the serving size on the "Nutrition Facts" label of packaged foods and drinks. The  amount of calories, carbs, fats, and other nutrients listed on the label is based on one serving of the item. Many items contain more than one serving per package.  Check the total grams (g) of carbs in one serving. You can calculate the number of servings of carbs in one serving by dividing the total carbs by 15. For example, if a food has 30 g of total carbs, it would be equal to 2 servings of carbs.  Check the number of grams (g) of saturated and trans fats in one serving. Choose foods that have low or no amount of these fats.  Check the number of milligrams (mg) of salt (sodium) in one serving. Most people should limit total sodium intake to less than 2,300 mg per day.  Always check the nutrition information of foods labeled as "low-fat" or "nonfat". These foods may be higher in added sugar or refined carbs and should be avoided.  Talk to your dietitian to identify your daily goals for nutrients listed on the label. Shopping  Avoid buying canned, premade, or processed foods. These foods tend to be high in fat, sodium, and added sugar.  Shop around the outside edge of the grocery store. This includes fresh fruits and vegetables, bulk grains, fresh meats, and fresh dairy. Cooking  Use low-heat cooking methods, such as baking, instead of high-heat cooking methods like deep frying.  Cook using healthy oils, such as olive, canola, or sunflower oil.  Avoid cooking with butter, cream, or high-fat meats. Meal planning  Eat meals and snacks regularly, preferably at the same times every day. Avoid going long periods of time without eating.  Eat foods high in fiber, such as fresh fruits, vegetables, beans, and whole grains. Talk to your dietitian about how many servings of carbs you can eat at each meal.  Eat 4-6 ounces (oz) of lean protein each day, such as lean meat, chicken, fish, eggs, or tofu. One oz of lean protein is equal to: ? 1 oz of meat, chicken, or fish. ? 1 egg. ?  cup of  tofu.  Eat some foods each day that contain healthy fats, such as avocado, nuts, seeds, and fish. Lifestyle  Check your blood glucose regularly.  Exercise regularly as told by your health care provider. This may include: ? 150 minutes of moderate-intensity or vigorous-intensity exercise each week. This could be brisk walking, biking, or water aerobics. ? Stretching and doing strength exercises, such as yoga or weightlifting, at least 2 times a week.  Take medicines as told by your health care provider.  Do not use any products that contain nicotine or tobacco, such as cigarettes and e-cigarettes. If you need help  quitting, ask your health care provider.  Work with a Social worker or diabetes educator to identify strategies to manage stress and any emotional and social challenges. Questions to ask a health care provider  Do I need to meet with a diabetes educator?  Do I need to meet with a dietitian?  What number can I call if I have questions?  When are the best times to check my blood glucose? Where to find more information:  American Diabetes Association: diabetes.org  Academy of Nutrition and Dietetics: www.eatright.[employer] of Diabetes and Digestive and Kidney Diseases (NIH): [employer] Summary  A healthy meal plan will help you control your blood glucose and maintain a healthy lifestyle.  Working with a diet and nutrition specialist (dietitian) can help you make a meal plan that is best for you.  Keep in mind that carbohydrates (carbs) and alcohol have immediate effects on your blood glucose levels. It is important to count carbs and to use alcohol carefully. This information is not intended to replace advice given to you by your health care provider. Make sure you discuss any questions you have with your health care provider. Document Revised: 02/02/2017 Document Reviewed: 03/27/2016 Elsevier Patient Education  2020 Elsevier Inc.      Agustina Caroli, MD [facility]

## 2019-12-15 NOTE — Assessment & Plan Note (Signed)
Uncontrolled diabetes with hemoglobin A1c at 10.3.  Patient has been off Trulicity for over 2 months.  Continue bedtime glargine insulin, Xigduo XR, and restart Trulicity. Diet and nutrition discussed. Follow-up in 3 months.

## 2019-12-16 ENCOUNTER — Other Ambulatory Visit: Payer: Self-pay | Admitting: Emergency Medicine

## 2019-12-16 DIAGNOSIS — E785 Hyperlipidemia, unspecified: Secondary | ICD-10-CM

## 2019-12-16 LAB — COMPREHENSIVE METABOLIC PANEL
ALT: 27 IU/L (ref 0–44)
AST: 18 IU/L (ref 0–40)
Albumin/Globulin Ratio: 1.5 (ref 1.2–2.2)
Albumin: 4.5 g/dL (ref 4.0–5.0)
Alkaline Phosphatase: 69 IU/L (ref 44–121)
BUN/Creatinine Ratio: 19 (ref 9–20)
BUN: 17 mg/dL (ref 6–24)
Bilirubin Total: 0.3 mg/dL (ref 0.0–1.2)
CO2: 22 mmol/L (ref 20–29)
Calcium: 9.4 mg/dL (ref 8.7–10.2)
Chloride: 101 mmol/L (ref 96–106)
Creatinine, Ser: 0.91 mg/dL (ref 0.76–1.27)
GFR calc Af Amer: 115 mL/min/{1.73_m2} (ref >59)
GFR calc non Af Amer: 99 mL/min/{1.73_m2} (ref >59)
Globulin, Total: 3.1 g/dL (ref 1.5–4.5)
Glucose: 195 mg/dL — ABNORMAL HIGH (ref 65–99)
Potassium: 4.3 mmol/L (ref 3.5–5.2)
Sodium: 135 mmol/L (ref 134–144)
Total Protein: 7.6 g/dL (ref 6.0–8.5)

## 2019-12-16 LAB — LIPID PANEL
Chol/HDL Ratio: 7.6 ratio — ABNORMAL HIGH (ref 0.0–5.0)
Cholesterol, Total: 235 mg/dL — ABNORMAL HIGH (ref 100–199)
HDL: 31 mg/dL — ABNORMAL LOW (ref >39)
LDL Chol Calc (NIH): 145 mg/dL — ABNORMAL HIGH (ref 0–99)
Triglycerides: 319 mg/dL — ABNORMAL HIGH (ref 0–149)
VLDL Cholesterol Cal: 59 mg/dL — ABNORMAL HIGH (ref 5–40)

## 2019-12-16 MED ORDER — ATORVASTATIN CALCIUM 40 MG PO TABS: 40.0000 mg | ORAL_TABLET | Freq: Every day | ORAL | 3 refills | Status: DC

## 2019-12-19 ENCOUNTER — Other Ambulatory Visit: Payer: Self-pay

## 2019-12-19 ENCOUNTER — Ambulatory Visit (INDEPENDENT_AMBULATORY_CARE_PROVIDER_SITE_OTHER): Payer: Commercial Managed Care - PPO | Admitting: Pediatrics

## 2019-12-19 DIAGNOSIS — Z23 Encounter for immunization: Secondary | ICD-10-CM | POA: Diagnosis not present

## 2019-12-19 NOTE — Progress Notes (Signed)
COVID vaccine #2. Indications, contraindications and side effects of vaccine/vaccines discussed with parent and parent verbally expressed understanding and also agreed with the administration of vaccine/vaccines as ordered above today.Handout (VIS) given for each vaccine at this visit.  

## 2020-03-01 ENCOUNTER — Other Ambulatory Visit: Payer: Self-pay

## 2020-03-01 ENCOUNTER — Encounter: Payer: Self-pay | Admitting: Emergency Medicine

## 2020-03-01 ENCOUNTER — Ambulatory Visit: Payer: Commercial Managed Care - PPO | Admitting: Emergency Medicine

## 2020-03-01 VITALS — BP 124/81 | HR 78 | Temp 98.0°F | Resp 16 | Ht 69.0 in | Wt 184.0 lb

## 2020-03-01 DIAGNOSIS — E1165 Type 2 diabetes mellitus with hyperglycemia: Secondary | ICD-10-CM

## 2020-03-01 DIAGNOSIS — E119 Type 2 diabetes mellitus without complications: Secondary | ICD-10-CM

## 2020-03-01 DIAGNOSIS — Z794 Long term (current) use of insulin: Secondary | ICD-10-CM

## 2020-03-01 DIAGNOSIS — E1169 Type 2 diabetes mellitus with other specified complication: Secondary | ICD-10-CM

## 2020-03-01 DIAGNOSIS — E785 Hyperlipidemia, unspecified: Secondary | ICD-10-CM | POA: Diagnosis not present

## 2020-03-01 DIAGNOSIS — E1159 Type 2 diabetes mellitus with other circulatory complications: Secondary | ICD-10-CM

## 2020-03-01 DIAGNOSIS — I10 Essential (primary) hypertension: Secondary | ICD-10-CM

## 2020-03-01 DIAGNOSIS — I152 Hypertension secondary to endocrine disorders: Secondary | ICD-10-CM

## 2020-03-01 LAB — POCT GLYCOSYLATED HEMOGLOBIN (HGB A1C): Hemoglobin A1C: 11.2 % — AB (ref 4.0–5.6)

## 2020-03-01 LAB — GLUCOSE, POCT (MANUAL RESULT ENTRY): POC Glucose: 412 mg/dl — AB (ref 70–99)

## 2020-03-01 MED ORDER — ONETOUCH ULTRASOFT LANCETS MISC: 12 refills | Status: AC

## 2020-03-01 MED ORDER — XIGDUO XR 5-1000 MG PO TB24: 1.0000 | ORAL_TABLET | Freq: Two times a day (BID) | ORAL | 1 refills | Status: DC

## 2020-03-01 MED ORDER — GLIPIZIDE 5 MG PO TABS: 5.0000 mg | ORAL_TABLET | Freq: Two times a day (BID) | ORAL | 3 refills | Status: DC

## 2020-03-01 MED ORDER — GLUCOSE BLOOD VI STRP: ORAL_STRIP | 3 refills | Status: AC

## 2020-03-01 MED ORDER — BASAGLAR KWIKPEN 100 UNIT/ML ~~LOC~~ SOPN: PEN_INJECTOR | SUBCUTANEOUS | 0 refills | Status: DC

## 2020-03-01 MED ORDER — FAMOTIDINE 20 MG PO TABS: 20.0000 mg | ORAL_TABLET | Freq: Two times a day (BID) | ORAL | 11 refills | Status: AC

## 2020-03-01 MED ORDER — ATORVASTATIN CALCIUM 40 MG PO TABS: 40.0000 mg | ORAL_TABLET | Freq: Every day | ORAL | 3 refills | Status: AC

## 2020-03-01 MED ORDER — LISINOPRIL 10 MG PO TABS
10.0000 mg | ORAL_TABLET | Freq: Every day | ORAL | 3 refills | Status: AC
Start: 2020-03-01 — End: ?

## 2020-03-01 NOTE — Patient Instructions (Addendum)
   If you have lab work done today you will be contacted with your lab results within the next 2 weeks.  If you have not heard from us then please contact us. The fastest way to get your results is to register for My Chart.   IF you received an x-ray today, you will receive an invoice from [facility]. Please contact [facility] at [phone] with questions or concerns regarding your invoice.   IF you received labwork today, you will receive an invoice from [facility]. Please contact [facility] at [phone] with questions or concerns regarding your invoice.   Our billing staff will not be able to assist you with questions regarding bills from these companies.  You will be contacted with the lab results as soon as they are available. The fastest way to get your results is to activate your My Chart account. Instructions are located on the last page of this paperwork. If you have not heard from us regarding the results in 2 weeks, please contact this office.      Diabetes Mellitus and Nutrition, Adult When you have diabetes (diabetes mellitus), it is very important to have healthy eating habits because your blood sugar (glucose) levels are greatly affected by what you eat and drink. Eating healthy foods in the appropriate amounts, at about the same times every day, can help you:  Control your blood glucose.  Lower your risk of heart disease.  Improve your blood pressure.  Reach or maintain a healthy weight. Every person with diabetes is different, and each person has different needs for a meal plan. Your health care provider may recommend that you work with a diet and nutrition specialist (dietitian) to make a meal plan that is best for you. Your meal plan may vary depending on factors such as:  The calories you need.  The medicines you take.  Your weight.  Your blood glucose, blood pressure, and cholesterol levels.  Your activity level.  Other health  conditions you have, such as heart or kidney disease. How do carbohydrates affect me? Carbohydrates, also called carbs, affect your blood glucose level more than any other type of food. Eating carbs naturally raises the amount of glucose in your blood. Carb counting is a method for keeping track of how many carbs you eat. Counting carbs is important to keep your blood glucose at a healthy level, especially if you use insulin or take certain oral diabetes medicines. It is important to know how many carbs you can safely have in each meal. This is different for every person. Your dietitian can help you calculate how many carbs you should have at each meal and for each snack. Foods that contain carbs include:  Bread, cereal, rice, pasta, and crackers.  Potatoes and corn.  Peas, beans, and lentils.  Milk and yogurt.  Fruit and juice.  Desserts, such as cakes, cookies, ice cream, and candy. How does alcohol affect me? Alcohol can cause a sudden decrease in blood glucose (hypoglycemia), especially if you use insulin or take certain oral diabetes medicines. Hypoglycemia can be a life-threatening condition. Symptoms of hypoglycemia (sleepiness, dizziness, and confusion) are similar to symptoms of having too much alcohol. If your health care provider says that alcohol is safe for you, follow these guidelines:  Limit alcohol intake to no more than 1 drink per day for nonpregnant women and 2 drinks per day for men. One drink equals 12 oz of beer, 5 oz of wine, or 1 oz of hard   liquor.  Do not drink on an empty stomach.  Keep yourself hydrated with water, diet soda, or unsweetened iced tea.  Keep in mind that regular soda, juice, and other mixers may contain a lot of sugar and must be counted as carbs. What are tips for following this plan?  Reading food labels  Start by checking the serving size on the "Nutrition Facts" label of packaged foods and drinks. The amount of calories, carbs, fats, and  other nutrients listed on the label is based on one serving of the item. Many items contain more than one serving per package.  Check the total grams (g) of carbs in one serving. You can calculate the number of servings of carbs in one serving by dividing the total carbs by 15. For example, if a food has 30 g of total carbs, it would be equal to 2 servings of carbs.  Check the number of grams (g) of saturated and trans fats in one serving. Choose foods that have low or no amount of these fats.  Check the number of milligrams (mg) of salt (sodium) in one serving. Most people should limit total sodium intake to less than 2,300 mg per day.  Always check the nutrition information of foods labeled as "low-fat" or "nonfat". These foods may be higher in added sugar or refined carbs and should be avoided.  Talk to your dietitian to identify your daily goals for nutrients listed on the label. Shopping  Avoid buying canned, premade, or processed foods. These foods tend to be high in fat, sodium, and added sugar.  Shop around the outside edge of the grocery store. This includes fresh fruits and vegetables, bulk grains, fresh meats, and fresh dairy. Cooking  Use low-heat cooking methods, such as baking, instead of high-heat cooking methods like deep frying.  Cook using healthy oils, such as olive, canola, or sunflower oil.  Avoid cooking with butter, cream, or high-fat meats. Meal planning  Eat meals and snacks regularly, preferably at the same times every day. Avoid going long periods of time without eating.  Eat foods high in fiber, such as fresh fruits, vegetables, beans, and whole grains. Talk to your dietitian about how many servings of carbs you can eat at each meal.  Eat 4-6 ounces (oz) of lean protein each day, such as lean meat, chicken, fish, eggs, or tofu. One oz of lean protein is equal to: ? 1 oz of meat, chicken, or fish. ? 1 egg. ?  cup of tofu.  Eat some foods each day that  contain healthy fats, such as avocado, nuts, seeds, and fish. Lifestyle  Check your blood glucose regularly.  Exercise regularly as told by your health care provider. This may include: ? 150 minutes of moderate-intensity or vigorous-intensity exercise each week. This could be brisk walking, biking, or water aerobics. ? Stretching and doing strength exercises, such as yoga or weightlifting, at least 2 times a week.  Take medicines as told by your health care provider.  Do not use any products that contain nicotine or tobacco, such as cigarettes and e-cigarettes. If you need help quitting, ask your health care provider.  Work with a counselor or diabetes educator to identify strategies to manage stress and any emotional and social challenges. Questions to ask a health care provider  Do I need to meet with a diabetes educator?  Do I need to meet with a dietitian?  What number can I call if I have questions?  When are the best   times to check my blood glucose? Where to find more information:  American Diabetes Association: diabetes.org  Academy of Nutrition and Dietetics: www.eatright.org  National Institute of Diabetes and Digestive and Kidney Diseases (NIH): www.niddk.nih.gov Summary  A healthy meal plan will help you control your blood glucose and maintain a healthy lifestyle.  Working with a diet and nutrition specialist (dietitian) can help you make a meal plan that is best for you.  Keep in mind that carbohydrates (carbs) and alcohol have immediate effects on your blood glucose levels. It is important to count carbs and to use alcohol carefully. This information is not intended to replace advice given to you by your health care provider. Make sure you discuss any questions you have with your health care provider. Document Revised: 02/02/2017 Document Reviewed: 03/27/2016 Elsevier Patient Education  2020 [employer]  

## 2020-03-01 NOTE — Assessment & Plan Note (Signed)
Well-controlled hypertension.  Continue present medication. Well-controlled diabetes with hemoglobin A1c at 11.2, worse than before. Continue insulin glargine 50 units at bedtime.  Continue dapagliflozin-Metformin twice a day.  Start glipizide 5 mg twice a day.  Patient was intolerant to Trulicity. Diet and nutrition discussed. Needs evaluation by endocrinologist.  Referral placed today. Follow-up in 3 months.

## 2020-03-01 NOTE — Progress Notes (Signed)
Jake Miller 49 y.o.   Chief Complaint  Patient presents with  . Diabetes    Follow up 3 month  . Medication Refill         HISTORY OF PRESENT ILLNESS: This is a 49 y.o. male with history of diabetes here for 52-monthfollow-up and medication refill. Last visit with uncontrolled diabetes.  Was started on Trulicity but had to discontinue due to GI side effects. Presently taking Xigduo XR twice a day and insulin glargine 50 units at bedtime. Also taking lisinopril 10 mg daily and atorvastatin 40 mg daily. Fully vaccinated against Covid. No other complaints or medical concerns today.  HPI   Prior to Admission medications   Medication Sig Start Date End Date Taking? Authorizing Provider  aspirin EC 81 MG tablet Take 81 mg by mouth daily.   Yes [provider]  atorvastatin (LIPITOR) 40 MG tablet Take 1 tablet (40 mg total) by mouth daily. 12/16/19  Yes Argelia Formisano, MInes Bloomer MD  blood glucose meter kit and supplies KIT Check blood sugar in morning before eating breakfast, and one hour after a meal. Dx codes: E11.9, Z79.4. 607-14-2016 Yes Brewington, Tishira R, PA-C  cyclobenzaprine (FLEXERIL) 5 MG tablet Take 2 tablets (10 mg total) by mouth at bedtime. 04/25/18  Yes SForrest Moron MD  Dapagliflozin-metFORMIN HCl ER (XIGDUO XR) 07-998 MG TB24 Take 1 tablet by mouth 2 (two) times daily with a meal. E11.65 12/15/19  Yes Meya Clutter, MInes Bloomer MD  Dulaglutide (TRULICITY) 1.5 MBF/3.8VASOPN One dose in the skin once a week 12/15/19  Yes Dainel Arcidiacono, MInes Bloomer MD  famotidine (PEPCID) 20 MG tablet Take 1 tablet (20 mg total) by mouth 2 (two) times daily before a meal. 101/15/2020 Yes Stallings, Zoe A, MD  gabapentin (NEURONTIN) 100 MG capsule Take 1 capsule (100 mg total) by mouth 2 (two) times daily. 06/09/19  Yes SForrest Moron MD  Insulin Glargine (BASAGLAR KWIKPEN) 100 UNIT/ML Inject 40 units at bedtime daily E11.65, Z79.4 12/15/19  Yes Trinika Cortese, MInes Bloomer MD  lisinopril  (ZESTRIL) 10 MG tablet Take 1 tablet (10 mg total) by mouth daily. 12/15/19  Yes Safia Panzer, MInes Bloomer MD  glucose blood (ONE TOUCH ULTRA TEST) test strip CHECK BLOOD SUGAR IN MORNING BEFORE EATING BREAKFAST AND 1 HOUR AFTER A MEAL 04/25/18   SForrest Moron MD  ONE TOUCH LANCETS MISC Check blood sugar in morning before eating breakfast, and one hour after a meal. Dx codes: E11.9, Z79.4. 12020-01-15  SForrest Moron MD    No Known Allergies  Patient Active Problem List   Diagnosis Date Noted  . Dyslipidemia associated with type 2 diabetes mellitus ([place] 1Jan 15, 2020 . Uncontrolled type 2 diabetes mellitus without complication, with long-term current use of insulin 02/26/2016  . Hypertension associated with diabetes ([facility] 04/01/2014  . GERD (gastroesophageal reflux disease) 01/06/2013    Past Medical History:  Diagnosis Date  . Diabetes mellitus   . Diabetes mellitus without complication ([facility]    Phreesia 12/12/2019  . GERD (gastroesophageal reflux disease)   . Hypertension     Past Surgical History:  Procedure Laterality Date  . EYE SURGERY N/A    Phreesia 12/12/2019    Social History   Socioeconomic History  . Marital status: Married    Spouse name: Not on file  . Number of children: 3  . Years of education: Not on file  . Highest education level: Not on file  Occupational History  . Not on file  Tobacco Use  . Smoking status: Former Research scientist (life sciences)  . Smokeless tobacco: Current User  Vaping Use  . Vaping Use: Never used  Substance and Sexual Activity  . Alcohol use: No    Alcohol/week: 0.0 standard drinks  . Drug use: No  . Sexual activity: Yes  Other Topics Concern  . Not on file  Social History Narrative  . Not on file   Social Determinants of Health   Financial Resource Strain: Not on file  Food Insecurity: Not on file  Transportation Needs: Not on file  Physical Activity: Not on file  Stress: Not on file  Social Connections: Not on file  Intimate Partner  Violence: Not on file    History reviewed. No pertinent family history.   Review of Systems  Constitutional: Negative.  Negative for chills and fever.  HENT: Negative.  Negative for congestion and sore throat.   Respiratory: Negative.  Negative for cough and shortness of breath.   Cardiovascular: Negative.  Negative for chest pain and palpitations.  Gastrointestinal: Negative.  Negative for abdominal pain, diarrhea, nausea and vomiting.  Genitourinary: Negative.  Negative for dysuria and hematuria.  Musculoskeletal: Negative.   Skin: Negative.  Negative for rash.  Neurological: Negative.  Negative for dizziness and headaches.  All other systems reviewed and are negative.  Today's Vitals   03/01/20 1513  BP: 124/81  Pulse: 78  Resp: 16  Temp: 98 F (36.7 C)  TempSrc: Temporal  SpO2: 97%  Weight: 184 lb (83.5 kg)  Height: '5\' 9"'  (1.753 m)   Body mass index is 27.17 kg/m. Wt Readings from Last 3 Encounters:  03/01/20 184 lb (83.5 kg)  12/15/19 188 lb (85.3 kg)  07/09/19 177 lb 9.6 oz (80.6 kg)     Physical Exam Vitals reviewed.  Constitutional:      Appearance: Normal appearance.  HENT:     Head: Normocephalic.  Eyes:     Extraocular Movements: Extraocular movements intact.     Conjunctiva/sclera: Conjunctivae normal.     Pupils: Pupils are equal, round, and reactive to light.  Cardiovascular:     Rate and Rhythm: Normal rate and regular rhythm.     Pulses: Normal pulses.     Heart sounds: Normal heart sounds.  Pulmonary:     Effort: Pulmonary effort is normal.     Breath sounds: Normal breath sounds.  Musculoskeletal:        General: Normal range of motion.     Cervical back: Normal range of motion and neck supple.  Skin:    General: Skin is warm and dry.     Capillary Refill: Capillary refill takes less than 2 seconds.  Neurological:     General: No focal deficit present.     Mental Status: He is alert and oriented to person, place, and time.   Psychiatric:        Mood and Affect: Mood normal.        Behavior: Behavior normal.    Results for orders placed or performed in visit on 03/01/20 (from the past 24 hour(s))  POCT glucose (manual entry)     Status: Abnormal   Collection Time: 03/01/20  3:17 PM  Result Value Ref Range   POC Glucose 412 (A) 70 - 99 mg/dl  POCT glycosylated hemoglobin (Hb A1C)     Status: Abnormal   Collection Time: 03/01/20  3:26 PM  Result Value Ref Range   Hemoglobin A1C 11.2 (A) 4.0 - 5.6 %   HbA1c POC (<>  result, manual entry)     HbA1c, POC (prediabetic range)     HbA1c, POC (controlled diabetic range)       ASSESSMENT & PLAN: Hypertension associated with diabetes ([place]) Well-controlled hypertension.  Continue present medication. Well-controlled diabetes with hemoglobin A1c at 11.2, worse than before. Continue insulin glargine 50 units at bedtime.  Continue dapagliflozin-Metformin twice a day.  Start glipizide 5 mg twice a day.  Patient was intolerant to Trulicity. Diet and nutrition discussed. Needs evaluation by endocrinologist.  Referral placed today. Follow-up in 3 months.  Jake Miller was seen today for diabetes and medication refill.  Diagnoses and all orders for this visit:  Hypertension associated with diabetes ([place]) -     Lancets (ONETOUCH ULTRASOFT) lancets; Use as instructed -     Ambulatory referral to Endocrinology  Type 2 diabetes mellitus with hyperglycemia, with long-term current use of insulin (H[facility]) -     POCT glucose (manual entry) -     POCT glycosylated hemoglobin (Hb A1C) -     Dapagliflozin-metFORMIN HCl ER (XIGDUO XR) 07-998 MG TB24; Take 1 tablet by mouth 2 (two) times daily with a meal. E11.65 -     Insulin Glargine (BASAGLAR KWIKPEN) 100 UNIT/ML; Inject 40 units at bedtime daily E11.65, Z79.4 -     Ambulatory referral to Endocrinology -     glipiZIDE (GLUCOTROL) 5 MG tablet; Take 1 tablet (5 mg total) by mouth 2 (two) times daily before a meal.  Dyslipidemia  associated with type 2 diabetes mellitus (HCC)  Dyslipidemia -     atorvastatin (LIPITOR) 40 MG tablet; Take 1 tablet (40 mg total) by mouth daily.  Essential hypertension -     lisinopril (ZESTRIL) 10 MG tablet; Take 1 tablet (10 mg total) by mouth daily.  Diabetes mellitus type 2, insulin dependent (HCC) -     glucose blood (ONE TOUCH ULTRA TEST) test strip; CHECK BLOOD SUGAR IN MORNING BEFORE EATING BREAKFAST AND 1 HOUR AFTER A MEAL  Other orders -     famotidine (PEPCID) 20 MG tablet; Take 1 tablet (20 mg total) by mouth 2 (two) times daily before a meal.    Patient Instructions       If you have lab work done today you will be contacted with your lab results within the next 2 weeks.  If you have not heard from [place] then please contact us. The fastest way to get your results is to register for My Chart.   IF you received an x-ray today, you will receive an invoice from [facility]. Please contact [facility] at [phone] with questions or concerns regarding your invoice.   IF you received labwork today, you will receive an invoice from [facility]. Please contact [facility] at [phone] with questions or concerns regarding your invoice.   Our billing staff will not be able to assist you with questions regarding bills from these companies.  You will be contacted with the lab results as soon as they are available. The fastest way to get your results is to activate your My Chart account. Instructions are located on the last page of this paperwork. If you have not heard from [place] regarding the results in 2 weeks, please contact this office.     Diabetes Mellitus and Nutrition, Adult When you have diabetes (diabetes mellitus), it is very important to have healthy eating habits because your blood sugar (glucose) levels are greatly affected by what you eat and drink. Eating healthy foods in the appropriate amounts, at about  the same times every day, can help  you:  Control your blood glucose.  Lower your risk of heart disease.  Improve your blood pressure.  Reach or maintain a healthy weight. Every person with diabetes is different, and each person has different needs for a meal plan. Your health care provider may recommend that you work with a diet and nutrition specialist (dietitian) to make a meal plan that is best for you. Your meal plan may vary depending on factors such as:  The calories you need.  The medicines you take.  Your weight.  Your blood glucose, blood pressure, and cholesterol levels.  Your activity level.  Other health conditions you have, such as heart or kidney disease. How do carbohydrates affect me? Carbohydrates, also called carbs, affect your blood glucose level more than any other type of food. Eating carbs naturally raises the amount of glucose in your blood. Carb counting is a method for keeping track of how many carbs you eat. Counting carbs is important to keep your blood glucose at a healthy level, especially if you use insulin or take certain oral diabetes medicines. It is important to know how many carbs you can safely have in each meal. This is different for every person. Your dietitian can help you calculate how many carbs you should have at each meal and for each snack. Foods that contain carbs include:  Bread, cereal, rice, pasta, and crackers.  Potatoes and corn.  Peas, beans, and lentils.  Milk and yogurt.  Fruit and juice.  Desserts, such as cakes, cookies, ice cream, and candy. How does alcohol affect me? Alcohol can cause a sudden decrease in blood glucose (hypoglycemia), especially if you use insulin or take certain oral diabetes medicines. Hypoglycemia can be a life-threatening condition. Symptoms of hypoglycemia (sleepiness, dizziness, and confusion) are similar to symptoms of having too much alcohol. If your health care provider says that alcohol is safe for you, follow these  guidelines:  Limit alcohol intake to no more than 1 drink per day for nonpregnant women and 2 drinks per day for men. One drink equals 12 oz of beer, 5 oz of wine, or 1 oz of hard liquor.  Do not drink on an empty stomach.  Keep yourself hydrated with water, diet soda, or unsweetened iced tea.  Keep in mind that regular soda, juice, and other mixers may contain a lot of sugar and must be counted as carbs. What are tips for following this plan?  Reading food labels  Start by checking the serving size on the "Nutrition Facts" label of packaged foods and drinks. The amount of calories, carbs, fats, and other nutrients listed on the label is based on one serving of the item. Many items contain more than one serving per package.  Check the total grams (g) of carbs in one serving. You can calculate the number of servings of carbs in one serving by dividing the total carbs by 15. For example, if a food has 30 g of total carbs, it would be equal to 2 servings of carbs.  Check the number of grams (g) of saturated and trans fats in one serving. Choose foods that have low or no amount of these fats.  Check the number of milligrams (mg) of salt (sodium) in one serving. Most people should limit total sodium intake to less than 2,300 mg per day.  Always check the nutrition information of foods labeled as "low-fat" or "nonfat". These foods may be higher in added sugar  or refined carbs and should be avoided.  Talk to your dietitian to identify your daily goals for nutrients listed on the label. Shopping  Avoid buying canned, premade, or processed foods. These foods tend to be high in fat, sodium, and added sugar.  Shop around the outside edge of the grocery store. This includes fresh fruits and vegetables, bulk grains, fresh meats, and fresh dairy. Cooking  Use low-heat cooking methods, such as baking, instead of high-heat cooking methods like deep frying.  Cook using healthy oils, such as olive,  canola, or sunflower oil.  Avoid cooking with butter, cream, or high-fat meats. Meal planning  Eat meals and snacks regularly, preferably at the same times every day. Avoid going long periods of time without eating.  Eat foods high in fiber, such as fresh fruits, vegetables, beans, and whole grains. Talk to your dietitian about how many servings of carbs you can eat at each meal.  Eat 4-6 ounces (oz) of lean protein each day, such as lean meat, chicken, fish, eggs, or tofu. One oz of lean protein is equal to: ? 1 oz of meat, chicken, or fish. ? 1 egg. ?  cup of tofu.  Eat some foods each day that contain healthy fats, such as avocado, nuts, seeds, and fish. Lifestyle  Check your blood glucose regularly.  Exercise regularly as told by your health care provider. This may include: ? 150 minutes of moderate-intensity or vigorous-intensity exercise each week. This could be brisk walking, biking, or water aerobics. ? Stretching and doing strength exercises, such as yoga or weightlifting, at least 2 times a week.  Take medicines as told by your health care provider.  Do not use any products that contain nicotine or tobacco, such as cigarettes and e-cigarettes. If you need help quitting, ask your health care provider.  Work with a Social worker or diabetes educator to identify strategies to manage stress and any emotional and social challenges. Questions to ask a health care provider  Do I need to meet with a diabetes educator?  Do I need to meet with a dietitian?  What number can I call if I have questions?  When are the best times to check my blood glucose? Where to find more information:  American Diabetes Association: diabetes.org  Academy of Nutrition and Dietetics: www.eatright.[employer] of Diabetes and Digestive and Kidney Diseases (NIH): [employer] Summary  A healthy meal plan will help you control your blood glucose and maintain a healthy  lifestyle.  Working with a diet and nutrition specialist (dietitian) can help you make a meal plan that is best for you.  Keep in mind that carbohydrates (carbs) and alcohol have immediate effects on your blood glucose levels. It is important to count carbs and to use alcohol carefully. This information is not intended to replace advice given to you by your health care provider. Make sure you discuss any questions you have with your health care provider. Document Revised: 02/02/2017 Document Reviewed: 03/27/2016 Elsevier Patient Education  2020 Elsevier Inc.      Agustina Caroli, MD [facility]

## 2020-04-30 ENCOUNTER — Other Ambulatory Visit: Payer: Self-pay

## 2020-05-04 ENCOUNTER — Encounter: Payer: Self-pay | Admitting: Endocrinology

## 2020-05-04 ENCOUNTER — Ambulatory Visit: Payer: Commercial Managed Care - PPO | Admitting: Endocrinology

## 2020-05-04 ENCOUNTER — Other Ambulatory Visit: Payer: Self-pay

## 2020-05-04 VITALS — BP 120/82 | HR 81 | Ht 69.0 in | Wt 187.4 lb

## 2020-05-04 DIAGNOSIS — E1169 Type 2 diabetes mellitus with other specified complication: Secondary | ICD-10-CM

## 2020-05-04 DIAGNOSIS — E1165 Type 2 diabetes mellitus with hyperglycemia: Secondary | ICD-10-CM

## 2020-05-04 DIAGNOSIS — Z794 Long term (current) use of insulin: Secondary | ICD-10-CM

## 2020-05-04 DIAGNOSIS — E785 Hyperlipidemia, unspecified: Secondary | ICD-10-CM | POA: Diagnosis not present

## 2020-05-04 LAB — POCT GLYCOSYLATED HEMOGLOBIN (HGB A1C): Hemoglobin A1C: 9.8 % — AB (ref 4.0–5.6)

## 2020-05-04 MED ORDER — BASAGLAR KWIKPEN 100 UNIT/ML ~~LOC~~ SOPN: 50.0000 [IU] | PEN_INJECTOR | SUBCUTANEOUS | 3 refills | Status: DC

## 2020-05-04 NOTE — Progress Notes (Signed)
Subjective:    Patient ID: Jake Miller, male    DOB: 09-08-1970, 50 y.o.   MRN: [id number]  HPI pt is referred by Dr Mitchel Honour, for diabetes.  Pt states DM was dx'ed in 4627; it is complicated by PDR; he has been on insulin since soon after dx; pt says his diet and exercise are good; he has never had pancreatitis, pancreatic surgery, severe hypoglycemia or DKA.   He says cbg varies from 69-200's.  It is in general higher as the day goes on.  He stopped Trulicity, due to nausea.   Past Medical History:  Diagnosis Date  . Diabetes mellitus   . Diabetes mellitus without complication ([place])    Phreesia 12/12/2019  . GERD (gastroesophageal reflux disease)   . Hypertension     Past Surgical History:  Procedure Laterality Date  . EYE SURGERY N/A    Phreesia 12/12/2019    Social History   Socioeconomic History  . Marital status: Married    Spouse name: Not on file  . Number of children: 3  . Years of education: Not on file  . Highest education level: Not on file  Occupational History  . Not on file  Tobacco Use  . Smoking status: Former Research scientist (life sciences)  . Smokeless tobacco: Current User  Vaping Use  . Vaping Use: Never used  Substance and Sexual Activity  . Alcohol use: No    Alcohol/week: 0.0 standard drinks  . Drug use: No  . Sexual activity: Yes  Other Topics Concern  . Not on file  Social History Narrative  . Not on file   Social Determinants of Health   Financial Resource Strain: Not on file  Food Insecurity: Not on file  Transportation Needs: Not on file  Physical Activity: Not on file  Stress: Not on file  Social Connections: Not on file  Intimate Partner Violence: Not on file    Current Outpatient Medications on File Prior to Visit  Medication Sig Dispense Refill  . aspirin EC 81 MG tablet Take 81 mg by mouth daily.    Marland Kitchen atorvastatin (LIPITOR) 40 MG tablet Take 1 tablet (40 mg total) by mouth daily. 90 tablet 3  . blood glucose meter kit and supplies KIT Check  blood sugar in morning before eating breakfast, and one hour after a meal. Dx codes: E11.9, Z79.4. 1 each 0  . cyclobenzaprine (FLEXERIL) 5 MG tablet Take 2 tablets (10 mg total) by mouth at bedtime. 90 tablet 1  . Dapagliflozin-metFORMIN HCl ER (XIGDUO XR) 07-998 MG TB24 Take 1 tablet by mouth 2 (two) times daily with a meal. E11.65 180 tablet 1  . famotidine (PEPCID) 20 MG tablet Take 1 tablet (20 mg total) by mouth 2 (two) times daily before a meal. 60 tablet 11  . gabapentin (NEURONTIN) 100 MG capsule Take 1 capsule (100 mg total) by mouth 2 (two) times daily. 60 capsule 3  . glucose blood (ONE TOUCH ULTRA TEST) test strip CHECK BLOOD SUGAR IN MORNING BEFORE EATING BREAKFAST AND 1 HOUR AFTER A MEAL 100 each 3  . Lancets (ONETOUCH ULTRASOFT) lancets Use as instructed 100 each 12  . lisinopril (ZESTRIL) 10 MG tablet Take 1 tablet (10 mg total) by mouth daily. 90 tablet 3  . ONE TOUCH LANCETS MISC Check blood sugar in morning before eating breakfast, and one hour after a meal. Dx codes: E11.9, Z79.4. 200 each 3   No current facility-administered medications on file prior to visit.    No Known  Allergies  Family History  Problem Relation Age of Onset  . Diabetes Father     BP 120/82 (BP Location: Right Arm, Patient Position: Sitting, Cuff Size: Normal)   Pulse 81   Ht '5\' 9"'  (1.753 m)   Wt 187 lb 6.4 oz (85 kg)   SpO2 97%   BMI 27.67 kg/m     Review of Systems denies weight loss, blurry vision, sob, n/v, urinary frequency, and depression.      Objective:   Physical Exam VITAL SIGNS:  See vs page GENERAL: no distress Pulses: dorsalis pedis intact bilat.   MSK: no deformity of the feet CV: no leg edema Skin:  no ulcer on the feet.  normal color and temp on the feet. Neuro: sensation is intact to touch on the feet  Lab Results  Component Value Date   HGBA1C 9.8 (A) 05/04/2020   Lab Results  Component Value Date   CREATININE 0.91 12/15/2019   BUN 17 12/15/2019   NA 135  12/15/2019   K 4.3 12/15/2019   CL 101 12/15/2019   CO2 22 12/15/2019   I have reviewed outside records, and summarized: Pt was noted to have elevated A1c, and referred here.  HTN and dyslipidemia were also addressed      Assessment & Plan:  Insulin-requiring type 2 DM, with PDR: uncontrolled  Patient Instructions  good diet and exercise significantly improve the control of your diabetes.  please let me know if you wish to be referred to a dietician.  high blood sugar is very risky to your health.  you should see an eye doctor and dentist every year.  It is very important to get all recommended vaccinations.  Controlling your blood pressure and cholesterol drastically reduces the damage diabetes does to your body.  Those who smoke should quit.  Please discuss these with your doctor.  check your blood sugar twice a day.  vary the time of day when you check, between before the 3 meals, and at bedtime.  also check if you have symptoms of your blood sugar being too high or too low.  please keep a record of the readings and bring it to your next appointment here (or you can bring the meter itself).  You can write it on any piece of paper.  please call us sooner if your blood sugar goes below 70, or if most of your readings are over 200.   We will need to take this complex situation in stages.   For now, please: Stop taking the glipizide, and:  Change the Basaglar to 50 units every morning, and:  Please continue the same Xigduo. Please come back for a follow-up appointment in 1 month. We'll plan for the Ramadan fast then, also.

## 2020-05-04 NOTE — Patient Instructions (Addendum)
good diet and exercise significantly improve the control of your diabetes.  please let me know if you wish to be referred to a dietician.  high blood sugar is very risky to your health.  you should see an eye doctor and dentist every year.  It is very important to get all recommended vaccinations.  Controlling your blood pressure and cholesterol drastically reduces the damage diabetes does to your body.  Those who smoke should quit.  Please discuss these with your doctor.  check your blood sugar twice a day.  vary the time of day when you check, between before the 3 meals, and at bedtime.  also check if you have symptoms of your blood sugar being too high or too low.  please keep a record of the readings and bring it to your next appointment here (or you can bring the meter itself).  You can write it on any piece of paper.  please call us sooner if your blood sugar goes below 70, or if most of your readings are over 200.   We will need to take this complex situation in stages.   For now, please: Stop taking the glipizide, and:  Change the Basaglar to 50 units every morning, and:  Please continue the same Xigduo. Please come back for a follow-up appointment in 1 month. We'll plan for the Ramadan fast then, also.

## 2020-05-31 ENCOUNTER — Ambulatory Visit: Payer: Commercial Managed Care - PPO | Admitting: Emergency Medicine

## 2020-06-04 ENCOUNTER — Ambulatory Visit: Payer: Commercial Managed Care - PPO | Admitting: Endocrinology

## 2020-07-16 ENCOUNTER — Ambulatory Visit (INDEPENDENT_AMBULATORY_CARE_PROVIDER_SITE_OTHER): Payer: Commercial Managed Care - PPO

## 2020-07-16 ENCOUNTER — Ambulatory Visit: Payer: Commercial Managed Care - PPO

## 2020-07-16 DIAGNOSIS — Z23 Encounter for immunization: Secondary | ICD-10-CM

## 2020-07-19 ENCOUNTER — Other Ambulatory Visit: Payer: Self-pay

## 2020-10-18 ENCOUNTER — Other Ambulatory Visit: Payer: Self-pay | Admitting: Emergency Medicine

## 2020-10-18 DIAGNOSIS — E1165 Type 2 diabetes mellitus with hyperglycemia: Secondary | ICD-10-CM

## 2020-10-18 DIAGNOSIS — Z794 Long term (current) use of insulin: Secondary | ICD-10-CM

## 2020-11-05 ENCOUNTER — Ambulatory Visit: Payer: Commercial Managed Care - PPO | Admitting: Endocrinology

## 2020-11-29 ENCOUNTER — Other Ambulatory Visit: Payer: Self-pay | Admitting: Endocrinology

## 2020-11-29 DIAGNOSIS — E1165 Type 2 diabetes mellitus with hyperglycemia: Secondary | ICD-10-CM

## 2021-01-10 ENCOUNTER — Other Ambulatory Visit: Payer: Self-pay

## 2021-01-10 ENCOUNTER — Other Ambulatory Visit: Payer: Self-pay | Admitting: Endocrinology

## 2021-01-10 ENCOUNTER — Ambulatory Visit: Payer: Commercial Managed Care - PPO | Admitting: Endocrinology

## 2021-01-10 VITALS — BP 140/90 | HR 79 | Ht 69.0 in | Wt 188.8 lb

## 2021-01-10 DIAGNOSIS — E785 Hyperlipidemia, unspecified: Secondary | ICD-10-CM

## 2021-01-10 DIAGNOSIS — E1169 Type 2 diabetes mellitus with other specified complication: Secondary | ICD-10-CM

## 2021-01-10 DIAGNOSIS — N529 Male erectile dysfunction, unspecified: Secondary | ICD-10-CM

## 2021-01-10 LAB — POCT GLYCOSYLATED HEMOGLOBIN (HGB A1C): Hemoglobin A1C: 10.9 % — AB (ref 4.0–5.6)

## 2021-01-10 MED ORDER — SILDENAFIL CITRATE 100 MG PO TABS: 100.0000 mg | ORAL_TABLET | Freq: Every day | ORAL | 11 refills | Status: AC | PRN

## 2021-01-10 MED ORDER — FREESTYLE LIBRE 2 READER DEVI: 1.0000 | Freq: Once | 1 refills | Status: AC

## 2021-01-10 MED ORDER — INSULIN ISOPHANE HUMAN 100 UNIT/ML KWIKPEN: 55.0000 [IU] | PEN_INJECTOR | SUBCUTANEOUS | 3 refills | Status: DC

## 2021-01-10 MED ORDER — FREESTYLE LIBRE 2 SENSOR MISC: 1.0000 | 3 refills | Status: AC

## 2021-01-10 NOTE — Patient Instructions (Addendum)
check your blood sugar twice a day.  vary the time of day when you check, between before the 3 meals, and at bedtime.  also check if you have symptoms of your blood sugar being too high or too low.  please keep a record of the readings and bring it to your next appointment here (or you can bring the meter itself).  You can write it on any piece of paper.  please call us sooner if your blood sugar goes below 70, or if most of your readings are over 200.   I have sent 2 prescriptions to your pharmacy: to change the Basaglar to NPH, 55 units every morning, and for the continuous glucose monitor sensors and a reader.   Please continue the same Xigduo.  Please come back for a follow-up appointment in January.

## 2021-01-10 NOTE — Progress Notes (Signed)
Subjective:    Patient ID: Jake Miller, male    DOB: May 21, 1970, 50 y.o.   MRN: [id number]  HPI Pt returns for f/u of diabetes mellitus: DM type: Insulin-requiring type 2 Dx'ed: 7322 Complications: PDR Therapy: insulin since soon after dx DKA: never Severe hypoglycemia: never Pancreatitis: never Pancreatic imaging: none known SDOH: none Other: he stopped Trulicity, due to nausea; he declines multiple daily injections Interval history: pt says he takes 52 units qam.  He says he never misses.  no cbg record, but states cbg varies from 70-200's.  It is in general higher as the day goes on.  He declines multiple daily injections.   Past Medical History:  Diagnosis Date   Diabetes mellitus    Diabetes mellitus without complication ([place])    Phreesia 12/12/2019   GERD (gastroesophageal reflux disease)    Hypertension     Past Surgical History:  Procedure Laterality Date   EYE SURGERY N/A    Phreesia 12/12/2019    Social History   Socioeconomic History   Marital status: Married    Spouse name: Not on file   Number of children: 3   Years of education: Not on file   Highest education level: Not on file  Occupational History   Not on file  Tobacco Use   Smoking status: Former   Smokeless tobacco: Current  Vaping Use   Vaping Use: Never used  Substance and Sexual Activity   Alcohol use: No    Alcohol/week: 0.0 standard drinks   Drug use: No   Sexual activity: Yes  Other Topics Concern   Not on file  Social History Narrative   Not on file   Social Determinants of Health   Financial Resource Strain: Not on file  Food Insecurity: Not on file  Transportation Needs: Not on file  Physical Activity: Not on file  Stress: Not on file  Social Connections: Not on file  Intimate Partner Violence: Not on file    Current Outpatient Medications on File Prior to Visit  Medication Sig Dispense Refill   aspirin EC 81 MG tablet Take 81 mg by mouth daily.     atorvastatin  (LIPITOR) 40 MG tablet Take 1 tablet (40 mg total) by mouth daily. 90 tablet 3   blood glucose meter kit and supplies KIT Check blood sugar in morning before eating breakfast, and one hour after a meal. Dx codes: E11.9, Z79.4. 1 each 0   cyclobenzaprine (FLEXERIL) 5 MG tablet Take 2 tablets (10 mg total) by mouth at bedtime. 90 tablet 1   famotidine (PEPCID) 20 MG tablet Take 1 tablet (20 mg total) by mouth 2 (two) times daily before a meal. 60 tablet 11   gabapentin (NEURONTIN) 100 MG capsule Take 1 capsule (100 mg total) by mouth 2 (two) times daily. 60 capsule 3   glucose blood (ONE TOUCH ULTRA TEST) test strip CHECK BLOOD SUGAR IN MORNING BEFORE EATING BREAKFAST AND 1 HOUR AFTER A MEAL 100 each 3   Lancets (ONETOUCH ULTRASOFT) lancets Use as instructed 100 each 12   lisinopril (ZESTRIL) 10 MG tablet Take 1 tablet (10 mg total) by mouth daily. 90 tablet 3   ONE TOUCH LANCETS MISC Check blood sugar in morning before eating breakfast, and one hour after a meal. Dx codes: E11.9, Z79.4. 200 each 3   XIGDUO XR 07-998 MG TB24 TAKE 1 TABLET BY MOUTH 2 (TWO) TIMES DAILY WITH A MEAL. E11.65 180 tablet 1   No current facility-administered medications on  file prior to visit.    No Known Allergies  Family History  Problem Relation Age of Onset   Diabetes Father     BP 140/90 (BP Location: Right Arm, Patient Position: Sitting, Cuff Size: Normal)   Pulse 79   Ht '5\' 9"'  (1.753 m)   Wt 188 lb 12.8 oz (85.6 kg)   SpO2 97%   BMI 27.88 kg/m    Review of Systems He denies hypoglycemia.      Objective:   Physical Exam    Lab Results  Component Value Date   HGBA1C 10.9 (A) 01/10/2021      Assessment & Plan:  Insulin-requiring type 2 DM: uncontrolled.  The pattern of his cbg's indicates he needs some adjustment in his therapy  Patient Instructions  check your blood sugar twice a day.  vary the time of day when you check, between before the 3 meals, and at bedtime.  also check if you have  symptoms of your blood sugar being too high or too low.  please keep a record of the readings and bring it to your next appointment here (or you can bring the meter itself).  You can write it on any piece of paper.  please call us sooner if your blood sugar goes below 70, or if most of your readings are over 200.   I have sent 2 prescriptions to your pharmacy: to change the Basaglar to NPH, 55 units every morning, and for the continuous glucose monitor sensors and a reader.   Please continue the same Xigduo.  Please come back for a follow-up appointment in January.

## 2021-01-11 LAB — T4, FREE: Free T4: 1 ng/dL (ref 0.60–1.60)

## 2021-01-11 LAB — TSH: TSH: 2.7 u[IU]/mL (ref 0.35–5.50)

## 2021-01-11 MED ORDER — NOVOLIN N FLEXPEN 100 UNIT/ML ~~LOC~~ SUPN: 55.0000 [IU] | PEN_INJECTOR | SUBCUTANEOUS | 3 refills | Status: DC

## 2021-01-12 LAB — TESTOSTERONE,FREE AND TOTAL
Testosterone, Free: 10.5 pg/mL (ref 6.8–21.5)
Testosterone: 419 ng/dL (ref 264–916)

## 2021-03-02 ENCOUNTER — Other Ambulatory Visit: Payer: Self-pay | Admitting: Endocrinology

## 2021-03-17 ENCOUNTER — Ambulatory Visit: Payer: Commercial Managed Care - PPO | Admitting: Endocrinology

## 2021-04-15 ENCOUNTER — Other Ambulatory Visit: Payer: Self-pay | Admitting: Endocrinology

## 2021-04-15 DIAGNOSIS — E1165 Type 2 diabetes mellitus with hyperglycemia: Secondary | ICD-10-CM

## 2021-04-15 DIAGNOSIS — Z794 Long term (current) use of insulin: Secondary | ICD-10-CM

## 2021-06-10 ENCOUNTER — Other Ambulatory Visit: Payer: Self-pay | Admitting: Endocrinology

## 2021-08-11 ENCOUNTER — Other Ambulatory Visit: Payer: Self-pay | Admitting: Neurosurgery

## 2021-08-11 DIAGNOSIS — M5416 Radiculopathy, lumbar region: Secondary | ICD-10-CM

## 2021-08-19 ENCOUNTER — Ambulatory Visit
Admission: RE | Admit: 2021-08-19 | Discharge: 2021-08-19 | Disposition: A | Discharge status: Home / Self Care | Payer: Commercial Managed Care - PPO | Source: Ambulatory Visit | Attending: Neurosurgery | Admitting: Neurosurgery

## 2021-08-19 DIAGNOSIS — M5416 Radiculopathy, lumbar region: Secondary | ICD-10-CM

## 2021-10-20 ENCOUNTER — Ambulatory Visit (HOSPITAL_COMMUNITY)
Admission: EM | Admit: 2021-10-20 | Discharge: 2021-10-20 | Disposition: A | Discharge status: Home / Self Care | Payer: Commercial Managed Care - PPO | Attending: Family Medicine | Admitting: Family Medicine

## 2021-10-20 ENCOUNTER — Encounter (HOSPITAL_COMMUNITY): Payer: Self-pay | Admitting: *Deleted

## 2021-10-20 DIAGNOSIS — K625 Hemorrhage of anus and rectum: Secondary | ICD-10-CM | POA: Insufficient documentation

## 2021-10-20 LAB — CBC
HCT: 38.7 % — ABNORMAL LOW (ref 39.0–52.0)
Hemoglobin: 14.1 g/dL (ref 13.0–17.0)
MCH: 30.1 pg (ref 26.0–34.0)
MCHC: 36.4 g/dL — ABNORMAL HIGH (ref 30.0–36.0)
MCV: 82.7 fL (ref 80.0–100.0)
Platelets: 323 10*3/uL (ref 150–400)
RBC: 4.68 MIL/uL (ref 4.22–5.81)
RDW: 12 % (ref 11.5–15.5)
WBC: 7.5 10*3/uL (ref 4.0–10.5)
nRBC: 0 % (ref 0.0–0.2)

## 2021-10-20 NOTE — ED Triage Notes (Signed)
Patient states that BRB in stool started this morning. He states no abdominal pain. He would also like refills of all of his meds, his PCP is with [facility].

## 2021-10-20 NOTE — ED Provider Notes (Addendum)
[facility]    CSN: [id number] Arrival date & time: 10/20/21  1637      History   Chief Complaint Chief Complaint  Patient presents with   Blood In Stools    HPI Jake Miller is a 51 y.o. male.   HPI Here for bright red blood per rectum today.  The first bowel movement he had today he had some blood come out.  No rectal pain and no abdominal pain.  He has had 2 or 3 other very small loose stools with some blood later today.  No fever and no vomiting.  In the last 2 or 3 days before this he has had diarrhea and loose stools, that he thinks is may be due to having eaten in [employer].  He did just return from [place] about 1 week ago.  Here for stated he needed new prescriptions of his medications, but he did just see his PCP at [facility] 2 months ago, and when I discussed with him that he should be able to contact them, he states he would just as soon do that then have [place] fill prescriptions tonight.  He has not been out of anything   Past Medical History:  Diagnosis Date   Diabetes mellitus    Diabetes mellitus without complication ([place])    Phreesia 12/12/2019   GERD (gastroesophageal reflux disease)    Hypertension     Patient Active Problem List   Diagnosis Date Noted   Erectile dysfunction 01/10/2021   Dyslipidemia associated with type 2 diabetes mellitus ([place]) 03/10/18   Uncontrolled type 2 diabetes mellitus without complication, with long-term current use of insulin 02/26/2016   Hypertension associated with diabetes ([place]) 04/01/2014   GERD (gastroesophageal reflux disease) 01/06/2013    Past Surgical History:  Procedure Laterality Date   EYE SURGERY N/A    Phreesia 12/12/2019       Home Medications    Prior to Admission medications   Medication Sig Start Date End Date Taking? Authorizing Provider  atorvastatin (LIPITOR) 40 MG tablet Take 1 tablet (40 mg total) by mouth daily. 03/01/20  Yes Horald Pollen, MD  blood glucose meter kit and  supplies KIT Check blood sugar in morning before eating breakfast, and one hour after a meal. Dx codes: E11.9, Z79.4. 09/07/2014  Yes [place], Tishira R, PA-C  Continuous Blood Gluc Sensor (FREESTYLE LIBRE 2 SENSOR) MISC 1 Device by Does not apply route every 14 (fourteen) days. 01/10/21  Yes Renato Shin, MD  cyclobenzaprine (FLEXERIL) 5 MG tablet Take 2 tablets (10 mg total) by mouth at bedtime. 04/25/18  Yes Delia Chimes A, MD  gabapentin (NEURONTIN) 100 MG capsule Take 1 capsule (100 mg total) by mouth 2 (two) times daily. 06/09/19  Yes Stallings, Zoe A, MD  glucose blood (ONE TOUCH ULTRA TEST) test strip CHECK BLOOD SUGAR IN MORNING BEFORE EATING BREAKFAST AND 1 HOUR AFTER A MEAL 03/01/20  Yes Sagardia, [place], MD  Lancets [facility] ULTRASOFT) lancets Use as instructed 03/01/20  Yes Sagardia, Elnora, MD  NOVOLIN N FLEXPEN 100 UNIT/ML FlexPen INJECT 55 UNITS INTO THE SKIN EVERY MORNING. 06/12/21  Yes Renato Shin, MD  ONE [facility] LANCETS MISC Check blood sugar in morning before eating breakfast, and one hour after a meal. Dx codes: E11.9, Z79.4. March 10, 2018  Yes Stallings, Zoe A, MD  sildenafil (VIAGRA) 100 MG tablet Take 1 tablet (100 mg total) by mouth daily as needed for erectile dysfunction. 01/10/21  Yes Renato Shin, MD  XIGDUO XR  07-998 MG TB24 TAKE 1 TABLET BY MOUTH 2 (TWO) TIMES DAILY WITH A MEAL. E11.65 04/15/21  Yes Renato Shin, MD  aspirin EC 81 MG tablet Take 81 mg by mouth daily.    [provider]  famotidine (PEPCID) 20 MG tablet Take 1 tablet (20 mg total) by mouth 2 (two) times daily before a meal. 03/01/20   Sagardia, Ines Bloomer, MD  lisinopril (ZESTRIL) 10 MG tablet Take 1 tablet (10 mg total) by mouth daily. 03/01/20   Horald Pollen, MD    Family History Family History  Problem Relation Age of Onset   Diabetes Father     Social History Social History   Tobacco Use   Smoking status: Former   Smokeless tobacco: Current  Scientific laboratory technician Use:  Never used  Substance Use Topics   Alcohol use: No    Alcohol/week: 0.0 standard drinks of alcohol   Drug use: No     Allergies   Patient has no known allergies.   Review of Systems Review of Systems   Physical Exam Triage Vital Signs ED Triage Vitals  Enc Vitals Group     BP 10/20/21 1703 (!) 142/90     Pulse Rate 10/20/21 1703 82     Resp 10/20/21 1703 18     Temp 10/20/21 1703 98.9 F (37.2 C)     Temp Source 10/20/21 1703 Oral     SpO2 10/20/21 1703 96 %     Weight --      Height --      Head Circumference --      Peak Flow --      Pain Score 10/20/21 1657 0     Pain Loc --      Pain Edu? --      Excl. in [place]? --    No data found.  Updated Vital Signs BP (!) 142/90 (BP Location: Right Arm)   Pulse 82   Temp 98.9 F (37.2 C) (Oral)   Resp 18   SpO2 96%   Visual Acuity Right Eye Distance:   Left Eye Distance:   Bilateral Distance:    Right Eye Near:   Left Eye Near:    Bilateral Near:     Physical Exam Vitals reviewed.  Constitutional:      General: He is not in acute distress.    Appearance: He is not ill-appearing, toxic-appearing or diaphoretic.  HENT:     Mouth/Throat:     Mouth: Mucous membranes are moist.  Eyes:     Extraocular Movements: Extraocular movements intact.     Pupils: Pupils are equal, round, and reactive to light.  Cardiovascular:     Rate and Rhythm: Normal rate and regular rhythm.     Heart sounds: No murmur heard. Pulmonary:     Effort: Pulmonary effort is normal.     Breath sounds: Normal breath sounds.  Abdominal:     General: There is no distension.     Palpations: Abdomen is soft. There is no mass.     Tenderness: There is no abdominal tenderness. There is no guarding.  Skin:    Coloration: Skin is not jaundiced or pale.  Neurological:     General: No focal deficit present.     Mental Status: He is alert and oriented to person, place, and time.  Psychiatric:        Behavior: Behavior normal.      UC  Treatments / Results  Labs (all labs  ordered are listed, but only abnormal results are displayed) Labs Reviewed  CBC  COMPREHENSIVE METABOLIC PANEL    EKG   Radiology No results found.  Procedures Procedures (including critical care time)  Medications Ordered in UC Medications - No data to display  Initial Impression / Assessment and Plan / UC Course  I have reviewed the triage vital signs and the nursing notes.  Pertinent labs & imaging results that were available during my care of the patient were reviewed by me and considered in my medical decision making (see chart for details).     Discussed with him that possible etiology would be some internal hemorrhoids that have been irritated by the diarrhea.  However, I did discuss with him also that it could be something more serious.  If he has any uncontrollable brisk bleeding, he should present to the emergency room.  He is given contact information for gastroenterology.  He will call his PCP tomorrow Final Clinical Impressions(s) / UC Diagnoses   Final diagnoses:  Rectal bleeding     Discharge Instructions      Drawn blood to check your blood counts, and to check your sugar, sodium and potassium, and kidney and liver function.  Staff will notify you if there is anything significantly abnormal and needs treatment.  Please proceed to the emergency room if you start having very brisk bleeding that she cannot get to stop.     ED Prescriptions   None    PDMP not reviewed this encounter.   Barrett Henle, MD 10/20/21 1744    Barrett Henle, MD 10/20/21 1745

## 2021-10-20 NOTE — Discharge Instructions (Addendum)
Drawn blood to check your blood counts, and to check your sugar, sodium and potassium, and kidney and liver function.  Staff will notify you if there is anything significantly abnormal and needs treatment.  Please proceed to the emergency room if you start having very brisk bleeding that she cannot get to stop.

## 2022-02-10 ENCOUNTER — Telehealth: Payer: Self-pay | Admitting: Gastroenterology

## 2022-02-10 NOTE — Telephone Encounter (Signed)
Patient came into the office with his Wife to see if he could schedule a colonoscopy. He requested [place] since his wife is also her patient and also because its closer to home. Has hx with [facility] medical, once we receive records, doctor will review and we will call him to schedule an appointment. Patient stated he preferred Fridays.

## 2023-09-25 IMAGING — MR MR LUMBAR SPINE W/O CM
5 of 6 series · 30 of 48 positions shown · non-contrast
Comparison: Lumbar spine radiographs 06/09/2019

CLINICAL DATA: Chronic right-sided low back pain radiating into the
right leg.

EXAM:
MRI LUMBAR SPINE WITHOUT CONTRAST
TECHNIQUE: Multiplanar, multisequence MR imaging of the lumbar spine was
performed. No intravenous contrast was administered.

[Series 3: T2 · sagittal · 4.0mm · 1.09mm/px · 5 of 17 slices shown (1 of 3)]
[im 1/17]
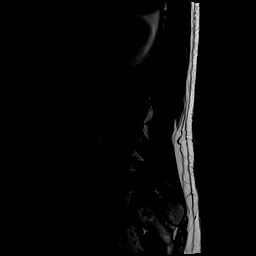
[im 5/17]
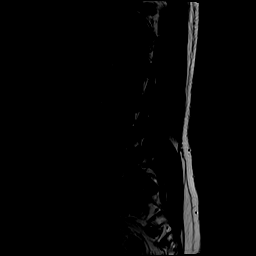
[im 9/17]
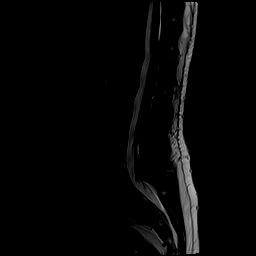
[im 13/17]
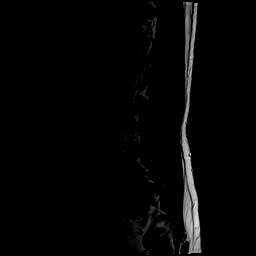
[im 17/17]
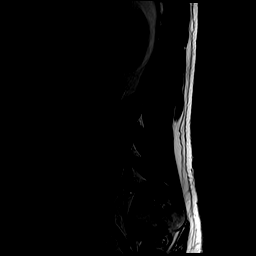

[Series 5: T1 · sagittal · 4.0mm · 1.09mm/px · 6 of 17 slices shown (1 of 2)]
[im 1/17]
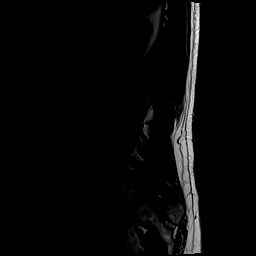
[im 4/17]
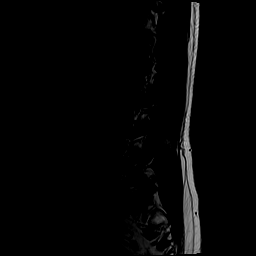
[im 7/17]
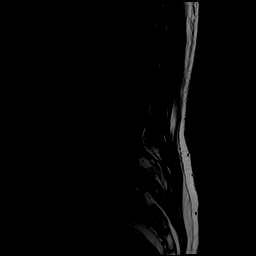
[im 10/17]
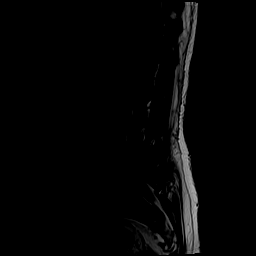
[im 13/17]
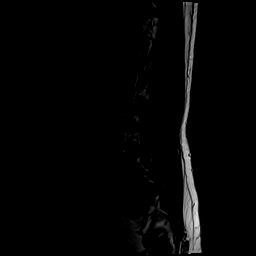
[im 17/17]
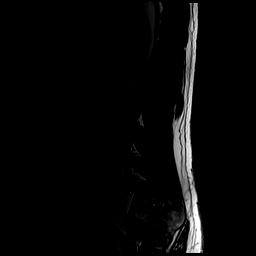

[Series 6: T2 · coronal · 4.0mm · 1.09mm/px · 6 of 19 slices shown (2 of 3)]
[im 1/19]
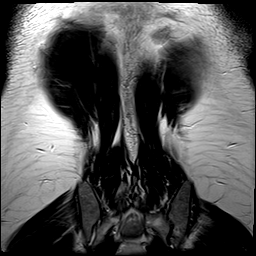
[im 4/19]
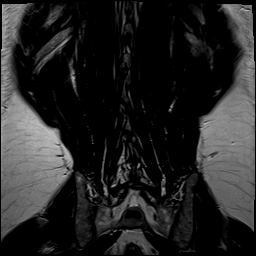
[im 8/19]
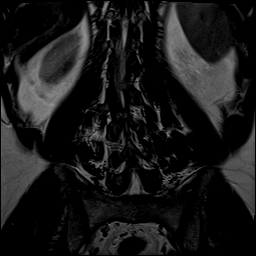
[im 11/19]
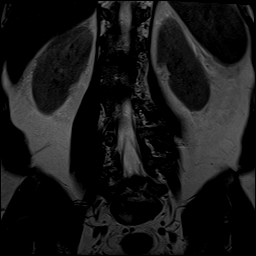
[im 15/19]
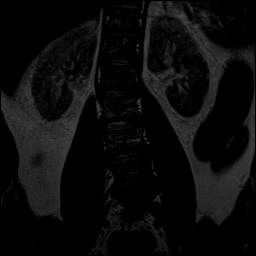
[im 19/19]
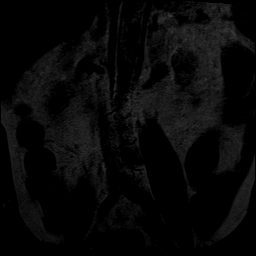

[Series 7: T2 · axial · 4.0mm · 0.39mm/px · z∈[-59,+153]mm · 9 of 39 slices shown (3 of 3)]
[im 1/39]
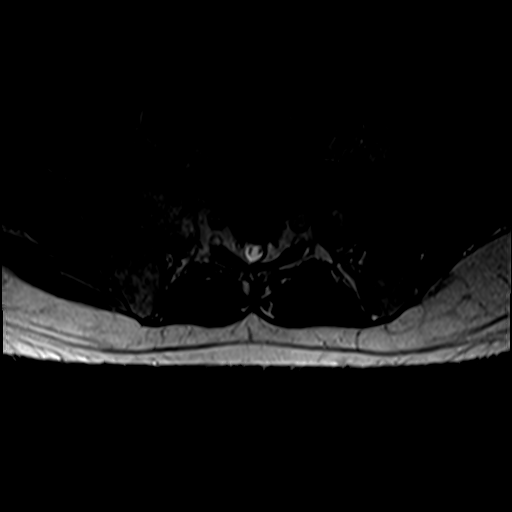
[im 7/39]
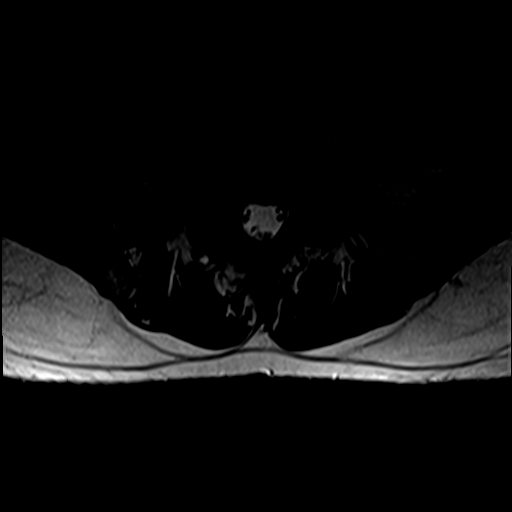
[im 13/39]
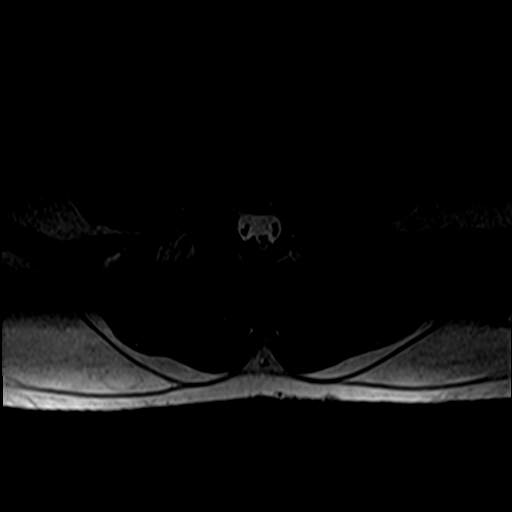
[im 16/39]
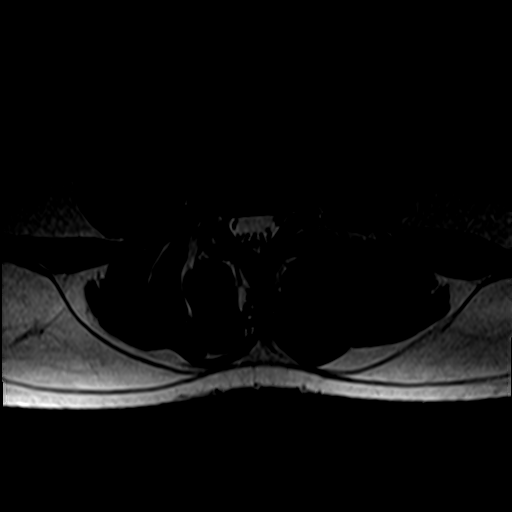
[im 20/39]
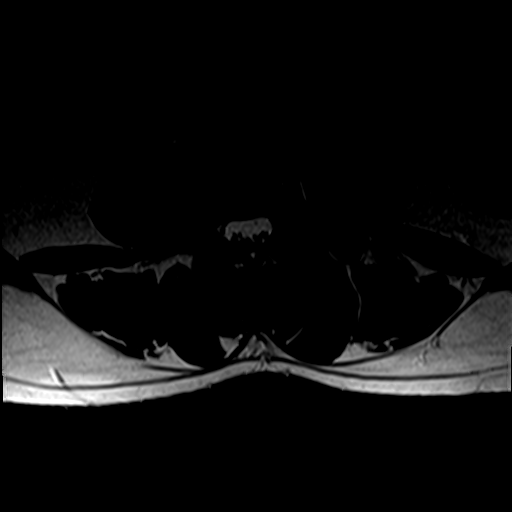
[im 23/39]
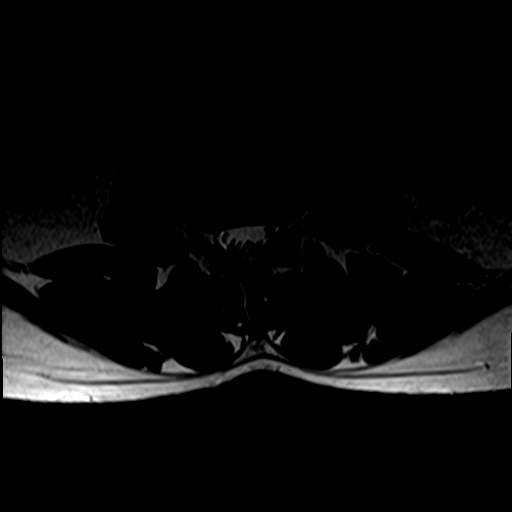
[im 26/39]
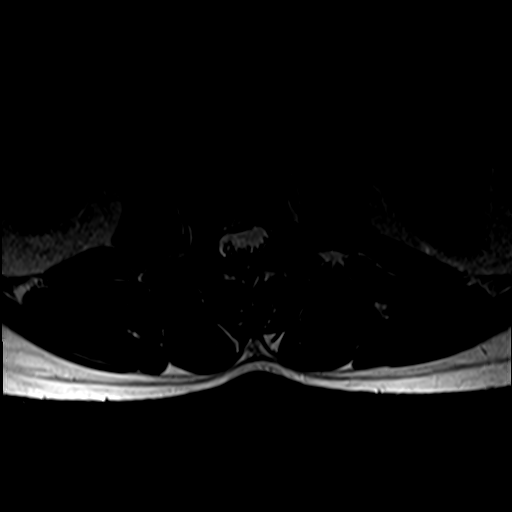
[im 32/39]
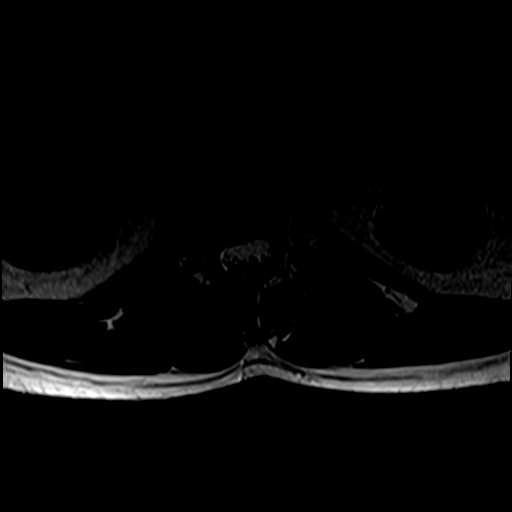
[im 39/39]
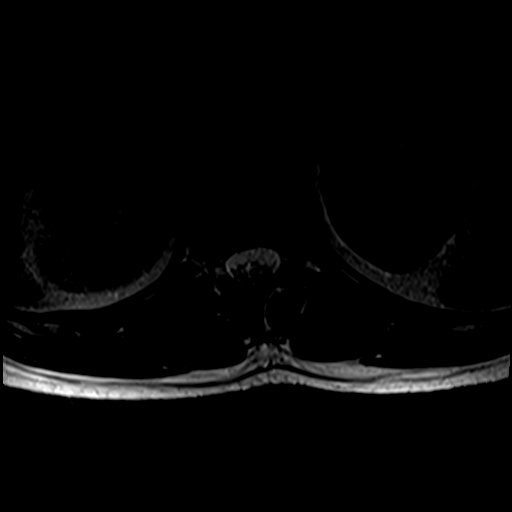

[Series 8: T1 · axial · 4.0mm · 0.39mm/px · z∈[-59,+43]mm · 4 of 39 slices shown (2 of 2)]
[im 1/39]
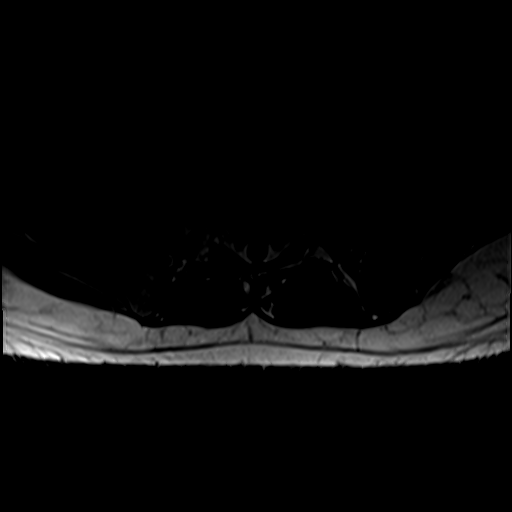
[im 7/39]
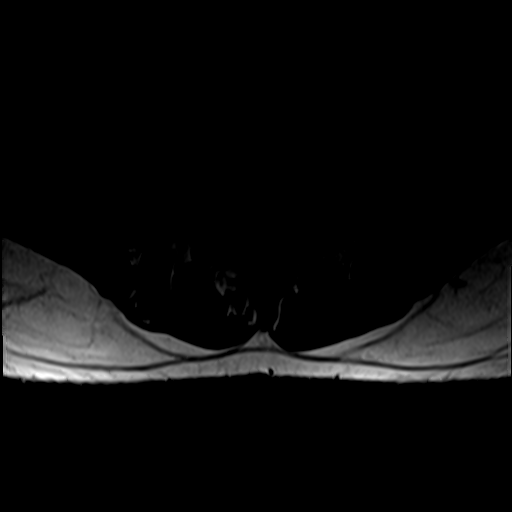
[im 13/39]
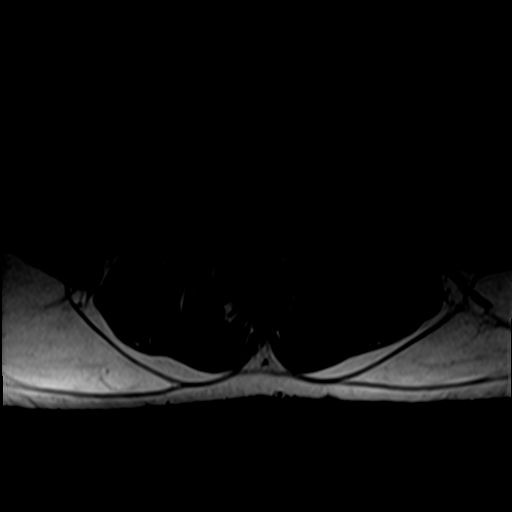
[im 16/39]
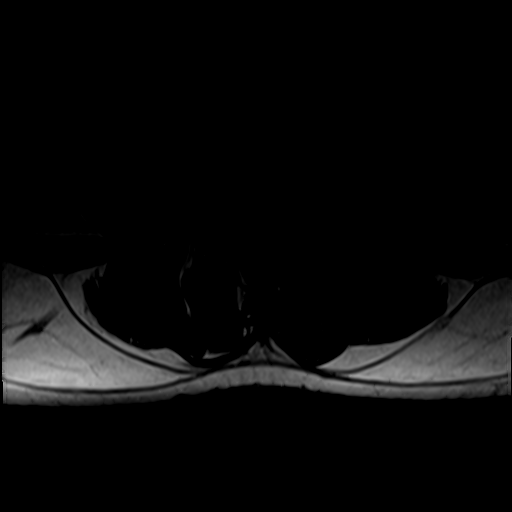

[30 of 48 positions shown; findings below may reference images not displayed]

FINDINGS: Segmentation:  Standard.

Alignment: Mild lumbar dextroscoliosis. Chronic bilateral L5 pars
defects with 7 mm anterolisthesis of L5 on S1.

Vertebrae: No fracture or suspicious marrow lesion. Moderate
degenerative endplate edema at L4-5 and L5-S1.

Conus medullaris and cauda equina: Conus extends to the T12 level.
Conus and cauda equina appear normal.

Paraspinal and other soft tissues: Unremarkable.

Disc levels:

T12-L1 through L2-3: Negative.

L3-4: Minimal disc bulging result in borderline to mild left neural
foraminal stenosis without spinal stenosis.

L4-5: Disc desiccation and moderate to severe posterior disc space
narrowing. Disc bulging, a central disc protrusion, and moderate
right and mild left facet and ligamentum flavum hypertrophy result
in mild bilateral lateral recess stenosis and mild right greater
than left neural foraminal stenosis without spinal stenosis.

L5-S1: Disc desiccation and moderate to severe disc space narrowing.
Anterolisthesis with bulging uncovered disc and spurring at the L5
pars defects result in severe right and moderate left neural
foraminal stenosis with potential bilateral L5 nerve root
impingement. No spinal stenosis.
IMPRESSION: 1. Chronic L5 pars defects with grade 1 anterolisthesis. Severe
L5-S1 disc degeneration with severe right and moderate left neural
foraminal stenosis.
2. Mild lateral recess and neural foraminal stenosis at L4-5.
